# Patient Record
Sex: Female | Born: 1980 | Race: White | Hispanic: No | Marital: Single | Smoking: Heavy tobacco smoker
Health system: Southern US, Community
[De-identification: ages and names within clinical notes are randomized; demographics above are authoritative.]

## PROBLEM LIST (undated history)

## (undated) ENCOUNTER — Inpatient Hospital Stay (HOSPITAL_COMMUNITY): Payer: Self-pay

## (undated) DIAGNOSIS — C801 Malignant (primary) neoplasm, unspecified: Secondary | ICD-10-CM

## (undated) DIAGNOSIS — C569 Malignant neoplasm of unspecified ovary: Secondary | ICD-10-CM

## (undated) HISTORY — PX: OOPHORECTOMY: SHX86

---
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
## 2001-10-29 ENCOUNTER — Emergency Department (HOSPITAL_COMMUNITY): Admission: EM | Admit: 2001-10-29 | Discharge: 2001-10-30 | Payer: Self-pay | Admitting: *Deleted

## 2002-01-30 ENCOUNTER — Encounter: Payer: Self-pay | Admitting: Family Medicine

## 2002-01-30 ENCOUNTER — Encounter: Admission: RE | Admit: 2002-01-30 | Discharge: 2002-01-30 | Payer: Self-pay | Admitting: Family Medicine

## 2002-01-31 ENCOUNTER — Encounter: Payer: Self-pay | Admitting: Family Medicine

## 2002-01-31 ENCOUNTER — Encounter: Admission: RE | Admit: 2002-01-31 | Discharge: 2002-01-31 | Payer: Self-pay | Admitting: Family Medicine

## 2006-03-24 ENCOUNTER — Encounter (INDEPENDENT_AMBULATORY_CARE_PROVIDER_SITE_OTHER): Payer: Self-pay | Admitting: *Deleted

## 2007-08-12 ENCOUNTER — Inpatient Hospital Stay (HOSPITAL_COMMUNITY): Admission: AD | Admit: 2007-08-12 | Discharge: 2007-08-12 | Payer: Self-pay | Admitting: Obstetrics and Gynecology

## 2007-08-14 ENCOUNTER — Inpatient Hospital Stay (HOSPITAL_COMMUNITY): Admission: AD | Admit: 2007-08-14 | Discharge: 2007-08-14 | Payer: Self-pay | Admitting: Obstetrics and Gynecology

## 2007-08-22 ENCOUNTER — Inpatient Hospital Stay (HOSPITAL_COMMUNITY): Admission: RE | Admit: 2007-08-22 | Discharge: 2007-08-24 | Payer: Self-pay | Admitting: Obstetrics and Gynecology

## 2007-08-22 ENCOUNTER — Encounter (INDEPENDENT_AMBULATORY_CARE_PROVIDER_SITE_OTHER): Payer: Self-pay | Admitting: Obstetrics and Gynecology

## 2008-07-07 ENCOUNTER — Emergency Department (HOSPITAL_COMMUNITY): Admission: EM | Admit: 2008-07-07 | Discharge: 2008-07-07 | Payer: Self-pay | Admitting: Emergency Medicine

## 2008-07-09 ENCOUNTER — Emergency Department (HOSPITAL_COMMUNITY): Admission: EM | Admit: 2008-07-09 | Discharge: 2008-07-09 | Payer: Self-pay | Admitting: Emergency Medicine

## 2009-07-14 ENCOUNTER — Ambulatory Visit (HOSPITAL_COMMUNITY): Admission: RE | Admit: 2009-07-14 | Discharge: 2009-07-14 | Payer: Self-pay | Admitting: Obstetrics and Gynecology

## 2010-04-11 LAB — URINE MICROSCOPIC-ADD ON

## 2010-04-11 LAB — URINALYSIS, ROUTINE W REFLEX MICROSCOPIC
Bilirubin Urine: NEGATIVE
Glucose, UA: NEGATIVE mg/dL
Ketones, ur: NEGATIVE mg/dL
Protein, ur: NEGATIVE mg/dL
Urobilinogen, UA: 0.2 mg/dL (ref 0.0–1.0)

## 2010-04-11 LAB — COMPREHENSIVE METABOLIC PANEL
Albumin: 4.1 g/dL (ref 3.5–5.2)
Alkaline Phosphatase: 67 U/L (ref 39–117)
BUN: 9 mg/dL (ref 6–23)
Chloride: 107 mEq/L (ref 96–112)
Creatinine, Ser: 0.74 mg/dL (ref 0.4–1.2)
Glucose, Bld: 87 mg/dL (ref 70–99)
Potassium: 4.1 mEq/L (ref 3.5–5.1)
Total Bilirubin: 0.9 mg/dL (ref 0.3–1.2)

## 2010-04-11 LAB — CBC
HCT: 43 % (ref 36.0–46.0)
Hemoglobin: 14.2 g/dL (ref 12.0–15.0)
MCV: 87.1 fL (ref 78.0–100.0)
Platelets: 201 10*3/uL (ref 150–400)
RDW: 13.3 % (ref 11.5–15.5)

## 2010-04-11 LAB — PROTIME-INR
INR: 1.11 (ref 0.00–1.49)
Prothrombin Time: 14.2 seconds (ref 11.6–15.2)

## 2010-04-11 LAB — APTT: aPTT: 29 seconds (ref 24–37)

## 2010-06-08 NOTE — Op Note (Signed)
[name], [name]               ACCOUNT NO.:  [id number]   MEDICAL RECORD NO.:  [id number]          PATIENT TYPE:  INP   LOCATION:  9142                          FACILITY:  [facility]   PHYSICIAN:  Randye Lobo, M.D.   DATE OF BIRTH:  November 19, 1980   DATE OF PROCEDURE:  08/22/2007  DATE OF DISCHARGE:                               OPERATIVE REPORT   PREOPERATIVE DIAGNOSES:  1. Intrauterine gestation at 74 weeks' gestation.  2. Recurrent fetal variable decelerations.   POSTOPERATIVE DIAGNOSES:  1. Intrauterine gestation at 41 weeks.  2. Recurrent fetal variable decelerations.   PROCEDURE:  Vacuum-assisted vaginal delivery with midline episiotomy and  repair.   SURGEON:  Randye Lobo, MD   ASSISTANT:  Gretchen Short, PA-C   ANESTHESIA:  Epidural, local with 1% lidocaine.   ESTIMATED BLOOD LOSS:  Approximately 300 mL.   URINE OUTPUT:  Per I&O catheterization prior to the procedure.   COMPLICATIONS:  None.   INDICATIONS FOR THE PROCEDURE:  The patient is a 30 year old gravida 1,  para 76 Caucasian female at 68 weeks' gestation, [facility] August 15, 2007, by  last menstrual period and 8-week ultrasound who presented for postdate  induction on the morning of August 22, 2007.  The patient had a last  office cervical exam of 3 cm of dilation.  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  The patient's antenatal course was otherwise  unremarkable.  Upon admission, the patient's cervix was 3 cm dilated and  70% effaced with the vertex at the -2 station.  The patient underwent  artificial rupture of membranes and clear fluid was noted.  The fetal  heart rate tracing was reactive and reassuring.  The patient received  Pitocin IV as well.   The patient received an epidural for anesthesia  during her labor.  Shortly thereafter, the fetus developed a 3-minute deceleration and the  patient was noted to have hypotension at that time, which was treated  with ephedrine.  The fetal heart rate then recovered and became reactive  and reassuring.  The patient progressed through a normal active phase of  her labor.   When the patient began pushing, the fetus developed variable  decelerations.  The decelerations became more progressive and deeper  down to the 60 and 80 range with pushing along with some hyper  variability of the fetal heart rate and some decelerations with a slow  return to baseline.  A recommendation was made therefore to proceed with  a vacuum-assisted vaginal delivery with episiotomy and repair after  risks and benefits were reviewed.  The patient chose to proceed.   FINDINGS:  A viable female infant was delivered at 2056.  Apgars were 8  at 1 minute and 9 at 5 minutes.  The weight was 9 pounds and 2 ounces.  The position was occiput anterior.  The placenta had a three-vessel  cord, which had the  absence of Wharton's jelly along a large portion of  the cord.  The cord was also noted to be hyperspiraled.  The placenta  was intact.   The midline episiotomy was a normal second degree episiotomy and there  was also a left periurethral laceration, which was first degree.   PROCEDURE:  The patient's bladder was catheterized of urine.  She was  examined and the vertex was noted to be at the 2+ station with pushing.  The position was noted to be occiput anterior.  The Mityvac was applied  over the fetal vertex and over one contraction the vertex was down to  the 3+ station.  The patient was then allowed to push spontaneously.  The fetus then developed again a deceleration into the 80 and 90 range  and the Mityvac was reapplied and proper pressure was applied and the  Mityvac was used over this final contraction.  During this contraction,  there was one pop-off and the  vacuum was reapplied.  The midline  episiotomy was performed and the patient was able to deliver  successfully.  The shoulders were delivered without difficulty followed  by the remainder of the newborn.   The cord was doubly clamped and cut.  Then newborn was vigorous and  placed on the maternal chest.   A repair of the midline episiotomy and the left periurethral laceration  was performed after additional local 1% lidocaine.  The episiotomy was  repaired in standard fashion with 2-0 and 0 Vicryl.  The left  periurethral laceration was repaired with one interrupted suture of 2-0  Vicryl.   The placenta was then spontaneously delivered.  The cervix and the  vagina were examined and there were no remaining lacerations.   This concluded the patient's procedure.  There were no complications.  All needle, instrument, and sponge counts were correct.      Randye Lobo, M.D.  Electronically Signed     BES/MEDQ  D:  08/22/2007  T:  08/23/2007  Job:  [id number]

## 2010-10-22 LAB — CBC
HCT: 30.1 — ABNORMAL LOW
HCT: 35.7 — ABNORMAL LOW
Hemoglobin: 10.3 — ABNORMAL LOW
Hemoglobin: 12.3
MCHC: 34.3
MCV: 88
MCV: 88.4
Platelets: 220
RBC: 3.4 — ABNORMAL LOW
RBC: 4.06
RDW: 14.4
WBC: 12.6 — ABNORMAL HIGH

## 2011-01-03 ENCOUNTER — Emergency Department (HOSPITAL_COMMUNITY)
Admission: EM | Admit: 2011-01-03 | Discharge: 2011-01-03 | Disposition: A | Discharge status: Home / Self Care | Payer: 59 | Attending: Emergency Medicine | Admitting: Emergency Medicine

## 2011-01-03 DIAGNOSIS — Z8543 Personal history of malignant neoplasm of ovary: Secondary | ICD-10-CM | POA: Insufficient documentation

## 2011-01-03 DIAGNOSIS — A599 Trichomoniasis, unspecified: Secondary | ICD-10-CM | POA: Insufficient documentation

## 2011-01-03 DIAGNOSIS — N898 Other specified noninflammatory disorders of vagina: Secondary | ICD-10-CM | POA: Insufficient documentation

## 2011-01-03 DIAGNOSIS — N939 Abnormal uterine and vaginal bleeding, unspecified: Secondary | ICD-10-CM

## 2011-01-03 DIAGNOSIS — N949 Unspecified condition associated with female genital organs and menstrual cycle: Secondary | ICD-10-CM | POA: Insufficient documentation

## 2011-01-03 HISTORY — DX: Malignant (primary) neoplasm, unspecified: C80.1

## 2011-01-03 HISTORY — DX: Malignant neoplasm of unspecified ovary: C56.9

## 2011-01-03 LAB — URINALYSIS, ROUTINE W REFLEX MICROSCOPIC
Bilirubin Urine: NEGATIVE
Glucose, UA: NEGATIVE mg/dL
Protein, ur: NEGATIVE mg/dL
Urobilinogen, UA: 0.2 mg/dL (ref 0.0–1.0)

## 2011-01-03 LAB — POCT PREGNANCY, URINE: Preg Test, Ur: NEGATIVE

## 2011-01-03 LAB — URINE MICROSCOPIC-ADD ON

## 2011-01-03 LAB — WET PREP, GENITAL

## 2011-01-03 MED ORDER — METRONIDAZOLE 500 MG PO TABS
2000.0000 mg | ORAL_TABLET | Freq: Once | ORAL | Status: AC
  Administered 2011-01-03: 2000 mg via ORAL
  Filled 2011-01-03: qty 4

## 2011-01-03 NOTE — ED Provider Notes (Signed)
History     CSN: [id number] Arrival date & time: 01/03/2011  1:55 PM   First MD Initiated Contact with Patient 01/03/11 1755      Chief Complaint  Patient presents with  . Vaginal Bleeding    (Consider location/radiation/quality/duration/timing/severity/associated sxs/prior treatment) Patient is a 30 y.o. female presenting with vaginal bleeding. The history is provided by the patient.  Vaginal Bleeding This is a new problem. The current episode started today. The problem has been resolved. Pertinent negatives include no abdominal pain, chest pain, chills, congestion, coughing, fatigue, fever, headaches, joint swelling, nausea, neck pain, numbness, rash, sore throat, vomiting or weakness. The symptoms are aggravated by nothing. She has tried nothing for the symptoms. The treatment provided no relief.   the patient has a history of left ovarian cancer with a salpingo-oophorectomy in 2004. She is also status post an elective abortion on 10/02/2010. Yesterday evening she felt a sharp pain in the left lower quadrant that was transient and resolve spontaneously after a few moments. This afternoon, she felt a gush of blood vaginally and has had no bleeding since that time. She denies any pain associated with the vaginal bleeding. She has had no prior episodes similar to this. Her last menstrual period ended on December 3 and was normal for her. She is G3 P1021  Past Medical History  Diagnosis Date  . Cancer   . Ovarian cancer     Past Surgical History  Procedure Date  . Oophorectomy     History reviewed. No pertinent family history.  History  Substance Use Topics  . Smoking status: Current Everyday Smoker  . Smokeless tobacco: Not on file  . Alcohol Use: Yes    OB History    Grav Para Term Preterm Abortions TAB SAB Ect Mult Living   3 1 1  2     1       Review of Systems  Constitutional: Negative for fever, chills and fatigue.  HENT: Negative for congestion, sore throat, neck  pain and neck stiffness.   Eyes: Negative for pain and visual disturbance.  Respiratory: Negative for cough, chest tightness and shortness of breath.   Cardiovascular: Negative for chest pain and leg swelling.  Gastrointestinal: Negative for nausea, vomiting, abdominal pain, diarrhea and constipation.  Genitourinary: Positive for vaginal bleeding. Negative for dysuria, hematuria, flank pain, vaginal discharge, vaginal pain and pelvic pain.  Musculoskeletal: Negative for back pain, joint swelling and gait problem.  Skin: Negative for pallor and rash.  Neurological: Negative for dizziness, syncope, weakness, light-headedness, numbness and headaches.  Hematological: Does not bruise/bleed easily.  Psychiatric/Behavioral: Negative for behavioral problems and confusion.    Allergies  Review of patient's allergies indicates no known allergies.  Home Medications  No current outpatient prescriptions on file.  BP 115/74  Pulse 69  Temp(Src) 98.1 F (36.7 C) (Oral)  Resp 16  SpO2 99%  LMP 12/27/2010  Physical Exam  Nursing note and vitals reviewed. Constitutional: She is oriented to person, place, and time. She appears well-developed and well-nourished. No distress.  HENT:  Head: Normocephalic and atraumatic.  Right Ear: External ear normal.  Left Ear: External ear normal.  Mouth/Throat: Oropharynx is clear and moist.  Eyes: Conjunctivae and EOM are normal. Pupils are equal, round, and reactive to light.  Neck: Normal range of motion. Neck supple.  Cardiovascular: Normal rate, regular rhythm, normal heart sounds and intact distal pulses.   Pulmonary/Chest: Effort normal and breath sounds normal. No respiratory distress. She has no wheezes. She  exhibits no tenderness.  Abdominal: Soft. Bowel sounds are normal. She exhibits no distension and no mass. There is no tenderness.  Genitourinary: There is no rash, tenderness or lesion on the right labia. There is no rash, tenderness or lesion on  the left labia. Uterus is not tender. Cervix exhibits no motion tenderness, no discharge and no friability. Right adnexum displays no mass and no fullness. Left adnexum displays no mass and no fullness.       There is a small amount of blood in the vagina. There is mild right adnexal tenderness. There is no left adnexal tenderness. Cervical os is closed.  Musculoskeletal: Normal range of motion. She exhibits no edema and no tenderness.  Neurological: She is alert and oriented to person, place, and time. No cranial nerve deficit.  Skin: Skin is warm and dry. No rash noted. She is not diaphoretic. No pallor.  Psychiatric: She has a normal mood and affect. Her behavior is normal.    ED Course  Procedures (including critical care time)  Labs Reviewed  URINALYSIS, ROUTINE W REFLEX MICROSCOPIC - Abnormal; Notable for the following:    Hgb urine dipstick LARGE (*)    Leukocytes, UA TRACE (*)    All other components within normal limits  WET PREP, GENITAL - Abnormal; Notable for the following:    Trich, Wet Prep MANY (*)    Clue Cells, Wet Prep RARE (*)    All other components within normal limits  POCT PREGNANCY, URINE  URINE MICROSCOPIC-ADD ON  POCT PREGNANCY, URINE  GC/CHLAMYDIA PROBE AMP, GENITAL   No results found.   Diagnosis #1: Abnormal Vaginal bleeding Diagnosis #2 Trichomonas   MDM  This patient had one episode of heavy vaginal bleeding with spontaneous resolution. Her pelvic exam does not show any significant findings. I do not feel that blood work is necessary as it is unlikely that she would have anemia after one episode of vaginal bleeding. Given her history of ovarian cancer I have advised close followup with her GYN, Dr. Edward Jolly. She will call them tomorrow to arrange followup. Her urine and wet prep showed trichomonas for which she has been treated in the emergency department; there is no outpatient treatment needed. She's been advised to return to the emergency department if  she has any significant pain or increasing bleeding.        Elwyn Reach [place], [place] 01/03/11 2013

## 2011-01-03 NOTE — ED Notes (Signed)
Patient presents with large amount of vaginal bleeding with abdominal cramping.  Last menstrual period finished last Tuesday without complications.  Denies pain and urinary symptoms.

## 2011-01-03 NOTE — ED Notes (Signed)
Patient reports she had an abortion on 10-02-2010

## 2011-01-03 NOTE — ED Provider Notes (Signed)
Medical screening examination/treatment/procedure(s) were performed by non-physician practitioner and as supervising physician I was immediately available for consultation/collaboration. Devoria Albe, MD, Armando Gang   Ward Givens, MD 01/03/11 [phone]

## 2011-01-04 LAB — GC/CHLAMYDIA PROBE AMP, GENITAL: Chlamydia, DNA Probe: NEGATIVE

## 2011-01-05 ENCOUNTER — Other Ambulatory Visit: Payer: Self-pay | Admitting: Obstetrics and Gynecology

## 2013-04-08 ENCOUNTER — Other Ambulatory Visit: Payer: Self-pay | Admitting: Obstetrics & Gynecology

## 2013-04-18 ENCOUNTER — Inpatient Hospital Stay (HOSPITAL_COMMUNITY)
Admission: AD | Admit: 2013-04-18 | Discharge: 2013-04-18 | Disposition: A | Discharge status: Home / Self Care | Payer: 59 | Source: Ambulatory Visit | Attending: Obstetrics and Gynecology | Admitting: Obstetrics and Gynecology

## 2013-04-18 ENCOUNTER — Encounter (HOSPITAL_COMMUNITY): Payer: Self-pay | Admitting: *Deleted

## 2013-04-18 ENCOUNTER — Inpatient Hospital Stay (HOSPITAL_COMMUNITY): Payer: 59

## 2013-04-18 DIAGNOSIS — O99330 Smoking (tobacco) complicating pregnancy, unspecified trimester: Secondary | ICD-10-CM | POA: Insufficient documentation

## 2013-04-18 DIAGNOSIS — O9989 Other specified diseases and conditions complicating pregnancy, childbirth and the puerperium: Principal | ICD-10-CM

## 2013-04-18 DIAGNOSIS — R748 Abnormal levels of other serum enzymes: Secondary | ICD-10-CM | POA: Insufficient documentation

## 2013-04-18 DIAGNOSIS — R1011 Right upper quadrant pain: Secondary | ICD-10-CM | POA: Insufficient documentation

## 2013-04-18 DIAGNOSIS — O99891 Other specified diseases and conditions complicating pregnancy: Secondary | ICD-10-CM | POA: Insufficient documentation

## 2013-04-18 DIAGNOSIS — K824 Cholesterolosis of gallbladder: Secondary | ICD-10-CM | POA: Insufficient documentation

## 2013-04-18 LAB — LIPASE, BLOOD: LIPASE: 110 U/L — AB (ref 11–59)

## 2013-04-18 LAB — CBC
HCT: 38 % (ref 36.0–46.0)
Hemoglobin: 13.2 g/dL (ref 12.0–15.0)
MCH: 29.1 pg (ref 26.0–34.0)
MCHC: 34.7 g/dL (ref 30.0–36.0)
MCV: 83.7 fL (ref 78.0–100.0)
PLATELETS: 215 10*3/uL (ref 150–400)
RBC: 4.54 MIL/uL (ref 3.87–5.11)
RDW: 13.9 % (ref 11.5–15.5)
WBC: 9.4 10*3/uL (ref 4.0–10.5)

## 2013-04-18 LAB — COMPREHENSIVE METABOLIC PANEL
ALBUMIN: 3.8 g/dL (ref 3.5–5.2)
ALT: 13 U/L (ref 0–35)
AST: 16 U/L (ref 0–37)
Alkaline Phosphatase: 54 U/L (ref 39–117)
BUN: 10 mg/dL (ref 6–23)
CALCIUM: 9.5 mg/dL (ref 8.4–10.5)
CO2: 23 mEq/L (ref 19–32)
CREATININE: 0.68 mg/dL (ref 0.50–1.10)
Chloride: 101 mEq/L (ref 96–112)
GFR calc Af Amer: >90 mL/min (ref >90)
GFR calc non Af Amer: >90 mL/min (ref >90)
Glucose, Bld: 82 mg/dL (ref 70–99)
Potassium: 4.1 mEq/L (ref 3.7–5.3)
Sodium: 139 mEq/L (ref 137–147)
TOTAL PROTEIN: 6.9 g/dL (ref 6.0–8.3)
Total Bilirubin: 0.3 mg/dL (ref 0.3–1.2)

## 2013-04-18 LAB — URINALYSIS, ROUTINE W REFLEX MICROSCOPIC
BILIRUBIN URINE: NEGATIVE
Glucose, UA: NEGATIVE mg/dL
HGB URINE DIPSTICK: NEGATIVE
KETONES UR: NEGATIVE mg/dL
Leukocytes, UA: NEGATIVE
Nitrite: NEGATIVE
PROTEIN: NEGATIVE mg/dL
Specific Gravity, Urine: 1.02 (ref 1.005–1.030)
UROBILINOGEN UA: 0.2 mg/dL (ref 0.0–1.0)
pH: 7 (ref 5.0–8.0)

## 2013-04-18 LAB — AMYLASE: Amylase: 98 U/L (ref 0–105)

## 2013-04-18 MED ORDER — GI COCKTAIL ~~LOC~~
30.0000 mL | Freq: Once | ORAL | Status: AC
  Administered 2013-04-18: 30 mL via ORAL
  Filled 2013-04-18: qty 30

## 2013-04-18 MED ORDER — TRAMADOL HCL 50 MG PO TABS: 50.0000 mg | ORAL_TABLET | Freq: Four times a day (QID) | ORAL | Status: DC | PRN

## 2013-04-18 MED ORDER — PANTOPRAZOLE SODIUM 20 MG PO TBEC: 20.0000 mg | DELAYED_RELEASE_TABLET | Freq: Every day | ORAL | Status: DC

## 2013-04-18 NOTE — MAU Provider Note (Signed)
History     CSN: [id number]  Arrival date and time: 04/18/13 1109   First Provider Initiated Contact with Patient 04/18/13 1218      Chief Complaint  Patient presents with  . Abdominal Pain   HPI This is a 33 y.o. female at [redacted]w[redacted]d who was sent from the office with c/o RUQ pain since this morning.  Denies nausea, vomiting or fever.  Pain radiates to right back/flank.  Has been seen for New OB and pregnancy has been going well.   RN Note: Patient states she started having upper right abdominal pain that radiates around to the right upper back at about 1000 this am. Denies nausea or vomiting  OB History   Grav Para Term Preterm Abortions TAB SAB Ect Mult Living   4 1 1  2     1       Past Medical History  Diagnosis Date  . Cancer   . Ovarian cancer     Past Surgical History  Procedure Laterality Date  . Oophorectomy      No family history on file.  History  Substance Use Topics  . Smoking status: Light Tobacco Smoker  . Smokeless tobacco: Not on file  . Alcohol Use: No    Allergies: No Known Allergies  Prescriptions prior to admission  Medication Sig Dispense Refill  . Prenatal Vit-Fe Fumarate-FA (PRENATAL MULTIVITAMIN) TABS tablet Take 1 tablet by mouth daily at 12 noon.        Review of Systems  Constitutional: Negative for fever, chills and malaise/fatigue.  Gastrointestinal: Positive for abdominal pain. Negative for nausea, vomiting, diarrhea and constipation.  Genitourinary: Negative for dysuria.  Musculoskeletal: Negative for myalgias.  Neurological: Negative for dizziness and headaches.   Physical Exam   Blood pressure 125/72, pulse 71, temperature 98.4 F (36.9 C), temperature source Oral, resp. rate 18, height 5\' 9"  (1.753 m), weight 97.614 kg (215 lb 3.2 oz), last menstrual period 02/11/2013, SpO2 99.00%.  Physical Exam  Constitutional: She is oriented to person, place, and time. She appears well-developed and well-nourished. No distress.  HENT:   Head: Normocephalic.  Cardiovascular: Normal rate.   Respiratory: Effort normal.  GI: Soft. She exhibits no distension and no mass. There is tenderness (tender over RUQ but no Murphy's sign.  No CVA tenderness). There is no rebound and no guarding.  Musculoskeletal: Normal range of motion.  Neurological: She is alert and oriented to person, place, and time.  Skin: Skin is warm and dry.  Psychiatric: She has a normal mood and affect.    MAU Course  Procedures  MDM Discussed with Dr Radene Knee Will get labs and RUQ [name]. Results for orders placed during the hospital encounter of 04/18/13 (from the past 24 hour(s))  URINALYSIS, ROUTINE W REFLEX MICROSCOPIC     Status: None   Collection Time    04/18/13 11:27 AM      Result Value Ref Range   Color, Urine YELLOW  YELLOW   APPearance CLEAR  CLEAR   Specific Gravity, Urine 1.020  1.005 - 1.030   pH 7.0  5.0 - 8.0   Glucose, UA NEGATIVE  NEGATIVE mg/dL   Hgb urine dipstick NEGATIVE  NEGATIVE   Bilirubin Urine NEGATIVE  NEGATIVE   Ketones, ur NEGATIVE  NEGATIVE mg/dL   Protein, ur NEGATIVE  NEGATIVE mg/dL   Urobilinogen, UA 0.2  0.0 - 1.0 mg/dL   Nitrite NEGATIVE  NEGATIVE   Leukocytes, UA NEGATIVE  NEGATIVE  CBC  Status: None   Collection Time    04/18/13 12:30 PM      Result Value Ref Range   WBC 9.4  4.0 - 10.5 K/uL   RBC 4.54  3.87 - 5.11 MIL/uL   Hemoglobin 13.2  12.0 - 15.0 g/dL   HCT 38.0  36.0 - 46.0 %   MCV 83.7  78.0 - 100.0 fL   MCH 29.1  26.0 - 34.0 pg   MCHC 34.7  30.0 - 36.0 g/dL   RDW 13.9  11.5 - 15.5 %   Platelets 215  150 - 400 K/uL  COMPREHENSIVE METABOLIC PANEL     Status: None   Collection Time    04/18/13 12:30 PM      Result Value Ref Range   Sodium 139  137 - 147 mEq/L   Potassium 4.1  3.7 - 5.3 mEq/L   Chloride 101  96 - 112 mEq/L   CO2 23  19 - 32 mEq/L   Glucose, Bld 82  70 - 99 mg/dL   BUN 10  6 - 23 mg/dL   Creatinine, Ser 0.68  0.50 - 1.10 mg/dL   Calcium 9.5  8.4 - 10.5 mg/dL   Total  Protein 6.9  6.0 - 8.3 g/dL   Albumin 3.8  3.5 - 5.2 g/dL   AST 16  0 - 37 U/L   ALT 13  0 - 35 U/L   Alkaline Phosphatase 54  39 - 117 U/L   Total Bilirubin 0.3  0.3 - 1.2 mg/dL   GFR calc non Af Amer >90  >90 mL/min   GFR calc Af Amer >90  >90 mL/min  AMYLASE     Status: None   Collection Time    04/18/13 12:30 PM      Result Value Ref Range   Amylase 98  0 - 105 U/L  LIPASE, BLOOD     Status: Abnormal   Collection Time    04/18/13 12:30 PM      Result Value Ref Range   Lipase 110 (*) 11 - 59 U/L   US Abdomen Limited Ruq  04/18/2013   CLINICAL DATA:  Right upper quadrant pain  EXAM: US ABDOMEN LIMITED - RIGHT UPPER QUADRANT  COMPARISON:  None.  FINDINGS: Gallbladder:  No gallstones or wall thickening visualized. No sonographic Murphy sign noted. There is a 4 mm echogenic non mobile, non shadowing focus along the gallbladder wall likely representing a small polyp.  Common bile duct:  Diameter:  4 mm  Liver:  No focal lesion identified. Within normal limits in parenchymal echogenicity.    IMPRESSION: 1. No cholelithiasis or sonographic evidence of acute cholecystitis. 2. Small gallbladder polyp.    Electronically Signed   By: Kathreen Devoid   On: 04/18/2013 13:29   Assessment and Plan  A:  SIUP at [redacted]w[redacted]d        Abdominal pain, right upper quadrant       Elevated Lipase       Gallbladder Polyp  P:  Discussed with Dr Philis Pique       Discharge home       Instructed to come back or call if develops N/V       Followup labs in one week in office       Will also add Rx for Tramadol and Protonix  [facility] 04/18/2013, 12:19 PM

## 2013-04-18 NOTE — MAU Note (Signed)
Patient states she started having upper right abdominal pain that radiates around to the right upper back at about 1000 this am. Denies nausea or vomiting.

## 2013-04-18 NOTE — Discharge Instructions (Signed)
Abdominal Pain During Pregnancy °Abdominal pain is common in pregnancy. Most of the time, it does not cause harm. There are many causes of abdominal pain. Some causes are more serious than others. Some of the causes of abdominal pain in pregnancy are easily diagnosed. Occasionally, the diagnosis takes time to understand. Other times, the cause is not determined. Abdominal pain can be a sign that something is very wrong with the pregnancy, or the pain may have nothing to do with the pregnancy at all. For this reason, always tell your health care provider if you have any abdominal discomfort. °HOME CARE INSTRUCTIONS  °Monitor your abdominal pain for any changes. The following actions may help to alleviate any discomfort you are experiencing: °· Do not have sexual intercourse or put anything in your vagina until your symptoms go away completely. °· Get plenty of rest until your pain improves. °· Drink clear fluids if you feel nauseous. Avoid solid food as long as you are uncomfortable or nauseous. °· Only take over-the-counter or prescription medicine as directed by your health care provider. °· Keep all follow-up appointments with your health care provider. °SEEK IMMEDIATE MEDICAL CARE IF: °· You are bleeding, leaking fluid, or passing tissue from the vagina. °· You have increasing pain or cramping. °· You have persistent vomiting. °· You have painful or bloody urination. °· You have a fever. °· You notice a decrease in your baby's movements. °· You have extreme weakness or feel faint. °· You have shortness of breath, with or without abdominal pain. °· You develop a severe headache with abdominal pain. °· You have abnormal vaginal discharge with abdominal pain. °· You have persistent diarrhea. °· You have abdominal pain that continues even after rest, or gets worse. °MAKE SURE YOU:  °· Understand these instructions. °· Will watch your condition. °· Will get help right away if you are not doing well or get  worse. °Document Released: 01/10/2005 Document Revised: 10/31/2012 Document Reviewed: 08/09/2012 °[employer]® Patient Information ©2014 [employer]. ° °

## 2013-04-24 NOTE — Progress Notes (Signed)
Prior authorization for Pantoprazole approved today 04/24/13 at 0829. Pt. Can now fill prescription.

## 2013-05-16 ENCOUNTER — Encounter (HOSPITAL_COMMUNITY): Payer: 59 | Admitting: Anesthesiology

## 2013-05-16 ENCOUNTER — Encounter (HOSPITAL_COMMUNITY): Payer: Self-pay

## 2013-05-16 ENCOUNTER — Ambulatory Visit (HOSPITAL_COMMUNITY)
Admission: RE | Admit: 2013-05-16 | Discharge: 2013-05-16 | Disposition: A | Discharge status: Home / Self Care | Payer: 59 | Source: Ambulatory Visit | Attending: Obstetrics & Gynecology | Admitting: Obstetrics & Gynecology

## 2013-05-16 ENCOUNTER — Encounter (HOSPITAL_COMMUNITY)
Admission: RE | Disposition: A | Discharge status: Home / Self Care | Payer: Self-pay | Source: Ambulatory Visit | Attending: Obstetrics & Gynecology

## 2013-05-16 ENCOUNTER — Ambulatory Visit (HOSPITAL_COMMUNITY): Payer: 59 | Admitting: Anesthesiology

## 2013-05-16 DIAGNOSIS — O021 Missed abortion: Secondary | ICD-10-CM | POA: Insufficient documentation

## 2013-05-16 DIAGNOSIS — F17200 Nicotine dependence, unspecified, uncomplicated: Secondary | ICD-10-CM | POA: Insufficient documentation

## 2013-05-16 DIAGNOSIS — O034 Incomplete spontaneous abortion without complication: Secondary | ICD-10-CM

## 2013-05-16 HISTORY — PX: DILATION AND EVACUATION: SHX1459

## 2013-05-16 LAB — CBC
HCT: 39.5 % (ref 36.0–46.0)
Hemoglobin: 13.9 g/dL (ref 12.0–15.0)
MCH: 29.6 pg (ref 26.0–34.0)
MCHC: 35.2 g/dL (ref 30.0–36.0)
MCV: 84.2 fL (ref 78.0–100.0)
PLATELETS: 219 10*3/uL (ref 150–400)
RBC: 4.69 MIL/uL (ref 3.87–5.11)
RDW: 13.7 % (ref 11.5–15.5)
WBC: 6.7 10*3/uL (ref 4.0–10.5)

## 2013-05-16 SURGERY — DILATION AND EVACUATION, UTERUS
Anesthesia: Monitor Anesthesia Care | Site: Vagina

## 2013-05-16 MED ORDER — LACTATED RINGERS IV SOLN
Freq: Once | INTRAVENOUS | Status: AC
  Administered 2013-05-16: 09:00:00 via INTRAVENOUS

## 2013-05-16 MED ORDER — PROPOFOL 10 MG/ML IV EMUL
INTRAVENOUS | Status: AC
  Filled 2013-05-16: qty 20

## 2013-05-16 MED ORDER — PROPOFOL 10 MG/ML IV EMUL
INTRAVENOUS | Status: DC | PRN
  Administered 2013-05-16: 30 mg via INTRAVENOUS
  Administered 2013-05-16: 50 mg via INTRAVENOUS
  Administered 2013-05-16 (×4): 30 mg via INTRAVENOUS

## 2013-05-16 MED ORDER — KETOROLAC TROMETHAMINE 30 MG/ML IJ SOLN: 15.0000 mg | Freq: Once | INTRAMUSCULAR | Status: DC | PRN

## 2013-05-16 MED ORDER — MIDAZOLAM HCL 2 MG/2ML IJ SOLN
INTRAMUSCULAR | Status: AC
  Filled 2013-05-16: qty 2

## 2013-05-16 MED ORDER — FENTANYL CITRATE 0.05 MG/ML IJ SOLN
INTRAMUSCULAR | Status: DC | PRN
  Administered 2013-05-16 (×2): 50 ug via INTRAVENOUS

## 2013-05-16 MED ORDER — SILVER NITRATE-POT NITRATE 75-25 % EX MISC
CUTANEOUS | Status: DC | PRN
  Administered 2013-05-16: 2

## 2013-05-16 MED ORDER — MEPERIDINE HCL 25 MG/ML IJ SOLN
6.2500 mg | INTRAMUSCULAR | Status: DC | PRN
Start: 2013-05-16 — End: 2013-05-16

## 2013-05-16 MED ORDER — DEXAMETHASONE SODIUM PHOSPHATE 10 MG/ML IJ SOLN
INTRAMUSCULAR | Status: DC | PRN
  Administered 2013-05-16: 10 mg via INTRAVENOUS

## 2013-05-16 MED ORDER — PROMETHAZINE HCL 25 MG/ML IJ SOLN: 6.2500 mg | INTRAMUSCULAR | Status: DC | PRN

## 2013-05-16 MED ORDER — FENTANYL CITRATE 0.05 MG/ML IJ SOLN
INTRAMUSCULAR | Status: AC
  Filled 2013-05-16: qty 2

## 2013-05-16 MED ORDER — MIDAZOLAM HCL 2 MG/2ML IJ SOLN
INTRAMUSCULAR | Status: DC | PRN
  Administered 2013-05-16: 2 mg via INTRAVENOUS

## 2013-05-16 MED ORDER — LIDOCAINE HCL (CARDIAC) 20 MG/ML IV SOLN
INTRAVENOUS | Status: AC
  Filled 2013-05-16: qty 5

## 2013-05-16 MED ORDER — KETOROLAC TROMETHAMINE 30 MG/ML IJ SOLN
INTRAMUSCULAR | Status: AC
  Filled 2013-05-16: qty 1

## 2013-05-16 MED ORDER — FENTANYL CITRATE 0.05 MG/ML IJ SOLN
25.0000 ug | INTRAMUSCULAR | Status: DC | PRN
  Administered 2013-05-16: 25 ug via INTRAVENOUS

## 2013-05-16 MED ORDER — LIDOCAINE HCL 1 % IJ SOLN
INTRAMUSCULAR | Status: DC | PRN
  Administered 2013-05-16: 10 mL

## 2013-05-16 MED ORDER — DEXAMETHASONE SODIUM PHOSPHATE 10 MG/ML IJ SOLN
INTRAMUSCULAR | Status: AC
  Filled 2013-05-16: qty 1

## 2013-05-16 MED ORDER — LIDOCAINE HCL (CARDIAC) 20 MG/ML IV SOLN
INTRAVENOUS | Status: DC | PRN
  Administered 2013-05-16: 80 mg via INTRAVENOUS

## 2013-05-16 MED ORDER — DOXYCYCLINE HYCLATE 100 MG IV SOLR
100.0000 mg | Freq: Once | INTRAVENOUS | Status: AC
  Administered 2013-05-16: 100 mg via INTRAVENOUS
  Filled 2013-05-16: qty 100

## 2013-05-16 MED ORDER — ONDANSETRON HCL 4 MG/2ML IJ SOLN
INTRAMUSCULAR | Status: AC
  Filled 2013-05-16: qty 2

## 2013-05-16 MED ORDER — LACTATED RINGERS IV SOLN
INTRAVENOUS | Status: DC | PRN
  Administered 2013-05-16 (×2): via INTRAVENOUS

## 2013-05-16 MED ORDER — LIDOCAINE HCL 1 % IJ SOLN
INTRAMUSCULAR | Status: AC
  Filled 2013-05-16: qty 20

## 2013-05-16 MED ORDER — MIDAZOLAM HCL 2 MG/2ML IJ SOLN: 0.5000 mg | Freq: Once | INTRAMUSCULAR | Status: DC | PRN

## 2013-05-16 SURGICAL SUPPLY — 21 items
CATH ROBINSON RED A/P 16FR (CATHETERS) ×2 IMPLANT
CLOTH BEACON ORANGE TIMEOUT ST (SAFETY) ×2 IMPLANT
DECANTER SPIKE VIAL GLASS SM (MISCELLANEOUS) ×2 IMPLANT
DILATOR CANAL MILEX (MISCELLANEOUS) IMPLANT
GLOVE BIO SURGEON STRL SZ 6.5 (GLOVE) ×2 IMPLANT
GLOVE BIOGEL PI IND STRL 7.0 (GLOVE) ×2 IMPLANT
GLOVE BIOGEL PI INDICATOR 7.0 (GLOVE) ×2
GOWN STRL REUS W/TWL LRG LVL3 (GOWN DISPOSABLE) ×4 IMPLANT
KIT BERKELEY 1ST TRIMESTER 3/8 (MISCELLANEOUS) ×2 IMPLANT
NEEDLE SPNL 22GX3.5 QUINCKE BK (NEEDLE) ×2 IMPLANT
NS IRRIG 1000ML POUR BTL (IV SOLUTION) ×2 IMPLANT
PACK VAGINAL MINOR WOMEN LF (CUSTOM PROCEDURE TRAY) ×2 IMPLANT
PAD OB MATERNITY 4.3X12.25 (PERSONAL CARE ITEMS) ×2 IMPLANT
PAD PREP 24X48 CUFFED NSTRL (MISCELLANEOUS) ×2 IMPLANT
SET BERKELEY SUCTION TUBING (SUCTIONS) ×2 IMPLANT
SYR CONTROL 10ML LL (SYRINGE) ×2 IMPLANT
TOWEL OR 17X24 6PK STRL BLUE (TOWEL DISPOSABLE) ×4 IMPLANT
VACURETTE 10 RIGID CVD (CANNULA) IMPLANT
VACURETTE 7MM CVD STRL WRAP (CANNULA) IMPLANT
VACURETTE 8 RIGID CVD (CANNULA) ×2 IMPLANT
VACURETTE 9 RIGID CVD (CANNULA) IMPLANT

## 2013-05-16 NOTE — Transfer of Care (Signed)
Immediate Anesthesia Transfer of Care Note  Patient: [name]  Procedure(s) Performed: Procedure(s): DILATATION AND EVACUATION (N/A)  Patient Location: PACU  Anesthesia Type:MAC  Level of Consciousness: awake, alert  and oriented  Airway & Oxygen Therapy: Patient Spontanous Breathing  Post-op Assessment: Report given to PACU RN, Post -op Vital signs reviewed and stable and Patient moving all extremities  Post vital signs: Reviewed and stable  Complications: No apparent anesthesia complications

## 2013-05-16 NOTE — Op Note (Signed)
Procedure(s): DILATATION AND EVACUATION Procedure Note  [name] female 33 y.o. 05/16/2013  Procedure(s) and Anesthesia Type:    * DILATATION AND EVACUATION - Choice  Surgeon(s) and Role:    * Sanjuana Kava, MD - Primary   Indications: The patient with retained products of conception after failed medical management     Surgeon: Sanjuana Kava   Assistants: none  Anesthesia: General mask inhalational anesthesia and Local anesthesia 1% buffered lidocaine  ASA Class: 2    Procedure Detail  DILATATION AND EVACUATION  Findings:  8 week size uterus to 6 size post procedure. Moderate amount of retained POC.  Good crie was achieved.  Estimated Blood Loss:  less than 50 mL         Drains: straight cath prior to procedure         Total IV Fluids: 1000 ml  Blood Given: none          Specimens: POC         Implants: none        Complications:  * No complications entered in OR log *         Technique:   After adequate anesthesia was achieved, the patient was prepped and draped in the usual sterile fashion.  The speculum was placed in the vagina and the cervix stabilized with a single-tooth tenaculum.  The cervix was dilated with Kennon Rounds dilators and the 8 mm curette was used to remove contents of the uterus.  Alternating sharp curettage with a curette and suction curettage was performed until all contents were removed and good crie was achieved.  All instruments were removed from the vagina.  The patient tolerated the procedure well.    Rogelio Seen Dynver [place]mson  Disposition: PACU - hemodynamically stable.         Condition: stable

## 2013-05-16 NOTE — Anesthesia Postprocedure Evaluation (Signed)
Anesthesia Post Note  Patient: [name]  Procedure(s) Performed: Procedure(s) (LRB): DILATATION AND EVACUATION (N/A)  Anesthesia type: MAC  Patient location: PACU  Post pain: Pain level controlled  Post assessment: Post-op Vital signs reviewed  Last Vitals:  Filed Vitals:   05/16/13 1030  BP: 101/64  Pulse: 59  Temp:   Resp: 16    Post vital signs: Reviewed  Level of consciousness: sedated  Complications: No apparent anesthesia complications

## 2013-05-16 NOTE — Discharge Instructions (Signed)
DISCHARGE INSTRUCTIONS: D&C / D&E The following instructions have been prepared to help you care for yourself upon your return home.  MAY TAKE IBUPROFEN AFTER 4:15 PM AS NEEDED FOR CRAMPS   Personal hygiene:  Use sanitary pads for vaginal drainage, not tampons.  Shower the day after your procedure.  NO tub baths, pools or Jacuzzis for 2-3 weeks.  Wipe front to back after using the bathroom.  Activity and limitations:  Do NOT drive or operate any equipment for 24 hours. The effects of anesthesia are still present and drowsiness may result.  Do NOT rest in bed all day.  Walking is encouraged.  Walk up and down stairs slowly.  You may resume your normal activity in one to two days or as indicated by your physician.  Sexual activity: NO intercourse for at least 2 weeks after the procedure, or as indicated by your physician.  Diet: Eat a light meal as desired this evening. You may resume your usual diet tomorrow.  Return to work: You may resume your work activities in one to two days or as indicated by your doctor.  What to expect after your surgery: Expect to have vaginal bleeding/discharge for 2-3 days and spotting for up to 10 days. It is not unusual to have soreness for up to 1-2 weeks. You may have a slight burning sensation when you urinate for the first day. Mild cramps may continue for a couple of days. You may have a regular period in 2-6 weeks.  Call your doctor for any of the following:  Excessive vaginal bleeding, saturating and changing one pad every hour.  Inability to urinate 6 hours after discharge from hospital.  Pain not relieved by pain medication.  Fever of 100.4 F or greater.  Unusual vaginal discharge or odor.   Call for an appointment:    Patients signature: ______________________  Nurses signature ________________________  Support person's signature_______________________

## 2013-05-16 NOTE — Discharge Summary (Signed)
Physician Discharge Summary  Patient ID: MADASYN HEATH MRN: [id number] DOB/AGE: 1980-09-10 33 y.o.  Admit date: 05/16/2013 Discharge date: 05/16/2013  Admission Diagnoses:  Discharge Diagnoses:  Active Problems:   * No active hospital problems. *   Discharged Condition: good  Hospital Course: Patient taken to OR for D&E after failed medical management.  Operative course uncomplicated.   Consults: None  Significant Diagnostic Studies: none  Treatments: IV hydration, antibiotics: Doxycycline, analgesia: acetaminophen w/ codeine and surgery: D&E  Discharge Exam: Blood pressure 115/66, pulse 94, temperature 97.9 F (36.6 C), temperature source Oral, resp. rate 18, height 5\' 9"  (1.753 m), weight 97.523 kg (215 lb), last menstrual period 02/11/2013, SpO2 98.00%. General appearance: alert, cooperative and no distress Pelvic: cervix normal in appearance, external genitalia normal, no adnexal masses or tenderness, no cervical motion tenderness, rectovaginal septum normal, uterus normal size, shape, and consistency and Minimal bleeding post op  Disposition: 01-Home or Self Care     Medication List         amphetamine-dextroamphetamine 20 MG tablet  Commonly known as:  ADDERALL  Take 20 mg by mouth daily.     doxycycline 100 MG tablet  Commonly known as:  VIBRA-TABS  Take 100 mg by mouth 2 (two) times daily.     OVER THE COUNTER MEDICATION  Take 1 tablet by mouth as needed. Patient takes over the counter medication for allergies           Follow-up Information   Follow up with [facility], Andrez Grime, MD. Call in 4 weeks.   Specialty:  Obstetrics and Gynecology   Contact information:   [address] [place] [address] [phone]       Signed: Sanjuana Kava 05/16/2013, 10:27 AM

## 2013-05-16 NOTE — H&P (Signed)
[name] is an 33 y.o. female presents for D&E for retained products of conception After failed medical management.   Pertinent Gynecological History: Menses: normal Bleeding: normal Contraception: none DES exposure: denies Blood transfusions: none Sexually transmitted diseases: no past history Previous GYN Procedures: LSO for endodermal sinus tumor  Last mammogram: N/A Last pap: normal Date: 2015 OB History: G4, P1   Menstrual History: Menarche age: 45  Patient's last menstrual period was 02/11/2013.    Past Medical History  Diagnosis Date  . Cancer   . Ovarian cancer     Past Surgical History  Procedure Laterality Date  . Oophorectomy      History reviewed. No pertinent family history.  Social History:  reports that she has been smoking Cigarettes.  She has been smoking about 1.00 pack per day. She does not have any smokeless tobacco history on file. She reports that she does not drink alcohol or use illicit drugs.  Allergies: No Known Allergies  Prescriptions prior to admission  Medication Sig Dispense Refill  . amphetamine-dextroamphetamine (ADDERALL) 20 MG tablet Take 20 mg by mouth daily.      Marland Kitchen doxycycline (VIBRA-TABS) 100 MG tablet Take 100 mg by mouth 2 (two) times daily.      Marland Kitchen OVER THE COUNTER MEDICATION Take 1 tablet by mouth as needed. Patient takes over the counter medication for allergies        Review of Systems  Constitutional: Negative.  Negative for fever and chills.  Eyes: Positive for double vision. Negative for blurred vision.  Cardiovascular: Negative for chest pain and palpitations.  Gastrointestinal: Negative for heartburn.  Genitourinary: Negative for dysuria.  Neurological: Negative for dizziness.  Endo/Heme/Allergies: Does not bruise/bleed easily.  Psychiatric/Behavioral: Negative for depression.    Blood pressure 115/66, pulse 94, temperature 97.9 F (36.6 C), temperature source Oral, resp. rate 18, height 5\' 9"  (1.753 m),  weight 97.523 kg (215 lb), last menstrual period 02/11/2013, SpO2 98.00%. Physical Exam  Nursing note and vitals reviewed. Constitutional: She appears well-developed and well-nourished.  HENT:  Head: Normocephalic.  Cardiovascular: Normal rate and regular rhythm.   Respiratory: Effort normal.  Genitourinary: Vagina normal and uterus normal.  Cervical exam yesterday in office: abnormal yellow discharge, no blood from os. No CMT, Uterus non tender Bedside TV sono: Heterogeneous material in endometrium, stripe measures 1.6cm  Neurological: She is alert. She has normal reflexes.    No results found for this or any previous visit (from the past 24 hour(s)).  No results found.  Assessment/Plan: 33 yo with retained POC after failed medical management To OR for D&E IV doxycycline for prophylaxis  [name] 05/16/2013, 9:13 AM

## 2013-05-16 NOTE — Brief Op Note (Signed)
05/16/2013  10:14 AM  PATIENT:  [name]  33 y.o. female  PRE-OPERATIVE DIAGNOSIS:  MAB  POST-OPERATIVE DIAGNOSIS:  * No post-op diagnosis entered *  PROCEDURE:  Procedure(s): DILATATION AND EVACUATION (N/A)  SURGEON:  Surgeon(s) and Role:    * Sanjuana Kava, MD - Primary  PHYSICIAN ASSISTANT:   ASSISTANTS: none   ANESTHESIA:   local and IV sedation PROCEDURE: D&E  EBL:  Total I/O In: 1000 [I.V.:1000] Out: 35 [Urine:10; Blood:25]  BLOOD ADMINISTERED:none  DRAINS: none   LOCAL MEDICATIONS USED:  LIDOCAINE   SPECIMEN:  Source of Specimen:  products of conception  DISPOSITION OF SPECIMEN:  PATHOLOGY  COUNTS:  YES  TOURNIQUET:  * No tourniquets in log *  DICTATION: .Note written in EPIC  PLAN OF CARE: Discharge to home after PACU  PATIENT DISPOSITION:  PACU - hemodynamically stable.   Delay start of Pharmacological VTE agent (>24hrs) due to surgical blood loss or risk of bleeding: no  [employer]

## 2013-05-16 NOTE — Anesthesia Preprocedure Evaluation (Signed)
Anesthesia Evaluation  Patient identified by MRN, date of birth, ID band Patient awake    Reviewed: Allergy & Precautions, H&P , Patient's Chart, lab work & pertinent test results, reviewed documented beta blocker date and time   History of Anesthesia Complications Negative for: history of anesthetic complications  Airway Mallampati: II TM Distance: >3 FB Neck ROM: full    Dental   Pulmonary Current Smoker,  breath sounds clear to auscultation        Cardiovascular Exercise Tolerance: Good Rhythm:regular Rate:Normal     Neuro/Psych negative psych ROS   GI/Hepatic   Endo/Other    Renal/GU      Musculoskeletal   Abdominal   Peds  Hematology   Anesthesia Other Findings   Reproductive/Obstetrics                           Anesthesia Physical Anesthesia Plan  ASA: II  Anesthesia Plan: MAC   Post-op Pain Management:    Induction:   Airway Management Planned:   Additional Equipment:   Intra-op Plan:   Post-operative Plan:   Informed Consent: I have reviewed the patients History and Physical, chart, labs and discussed the procedure including the risks, benefits and alternatives for the proposed anesthesia with the patient or authorized representative who has indicated his/her understanding and acceptance.   Dental Advisory Given  Plan Discussed with: CRNA, Surgeon and Anesthesiologist  Anesthesia Plan Comments:         Anesthesia Quick Evaluation  

## 2013-05-17 ENCOUNTER — Encounter (HOSPITAL_COMMUNITY): Payer: Self-pay | Admitting: Obstetrics & Gynecology

## 2013-09-19 ENCOUNTER — Encounter (HOSPITAL_COMMUNITY): Payer: Self-pay | Admitting: *Deleted

## 2013-09-20 ENCOUNTER — Encounter (HOSPITAL_COMMUNITY): Payer: Self-pay | Admitting: Anesthesiology

## 2013-09-20 ENCOUNTER — Encounter (HOSPITAL_COMMUNITY)
Admission: RE | Disposition: A | Discharge status: Home / Self Care | Payer: Self-pay | Source: Ambulatory Visit | Attending: Obstetrics & Gynecology

## 2013-09-20 ENCOUNTER — Encounter (HOSPITAL_COMMUNITY): Payer: 59 | Admitting: Anesthesiology

## 2013-09-20 ENCOUNTER — Ambulatory Visit (HOSPITAL_COMMUNITY): Payer: 59 | Admitting: Anesthesiology

## 2013-09-20 ENCOUNTER — Ambulatory Visit (HOSPITAL_COMMUNITY)
Admission: RE | Admit: 2013-09-20 | Discharge: 2013-09-20 | Disposition: A | Discharge status: Home / Self Care | Payer: 59 | Source: Ambulatory Visit | Attending: Obstetrics & Gynecology | Admitting: Obstetrics & Gynecology

## 2013-09-20 DIAGNOSIS — F17200 Nicotine dependence, unspecified, uncomplicated: Secondary | ICD-10-CM | POA: Insufficient documentation

## 2013-09-20 DIAGNOSIS — O034 Incomplete spontaneous abortion without complication: Secondary | ICD-10-CM | POA: Diagnosis present

## 2013-09-20 HISTORY — PX: DILATION AND EVACUATION: SHX1459

## 2013-09-20 LAB — CBC
HCT: 37.3 % (ref 36.0–46.0)
Hemoglobin: 13.3 g/dL (ref 12.0–15.0)
MCH: 30.5 pg (ref 26.0–34.0)
MCHC: 35.7 g/dL (ref 30.0–36.0)
MCV: 85.6 fL (ref 78.0–100.0)
Platelets: 206 K/uL (ref 150–400)
RBC: 4.36 MIL/uL (ref 3.87–5.11)
RDW: 13.2 % (ref 11.5–15.5)
WBC: 8.4 K/uL (ref 4.0–10.5)

## 2013-09-20 SURGERY — DILATION AND EVACUATION, UTERUS
Anesthesia: Monitor Anesthesia Care | Site: Vagina

## 2013-09-20 MED ORDER — KETOROLAC TROMETHAMINE 30 MG/ML IJ SOLN: 15.0000 mg | Freq: Once | INTRAMUSCULAR | Status: DC | PRN

## 2013-09-20 MED ORDER — LIDOCAINE HCL (CARDIAC) 20 MG/ML IV SOLN
INTRAVENOUS | Status: DC | PRN
  Administered 2013-09-20 (×2): 50 mg via INTRAVENOUS

## 2013-09-20 MED ORDER — MIDAZOLAM HCL 2 MG/2ML IJ SOLN
INTRAMUSCULAR | Status: AC
  Filled 2013-09-20: qty 2

## 2013-09-20 MED ORDER — DEXAMETHASONE SODIUM PHOSPHATE 10 MG/ML IJ SOLN
INTRAMUSCULAR | Status: AC
  Filled 2013-09-20: qty 1

## 2013-09-20 MED ORDER — LIDOCAINE-EPINEPHRINE 1 %-1:100000 IJ SOLN
INTRAMUSCULAR | Status: AC
Start: 2013-09-20 — End: 2013-09-20
  Filled 2013-09-20: qty 1

## 2013-09-20 MED ORDER — DEXAMETHASONE SODIUM PHOSPHATE 4 MG/ML IJ SOLN
INTRAMUSCULAR | Status: DC | PRN
  Administered 2013-09-20: 10 mg via INTRAVENOUS

## 2013-09-20 MED ORDER — PROPOFOL 10 MG/ML IV BOLUS
INTRAVENOUS | Status: DC | PRN
  Administered 2013-09-20: 40 mg via INTRAVENOUS
  Administered 2013-09-20: 20 mg via INTRAVENOUS
  Administered 2013-09-20: 40 mg via INTRAVENOUS
  Administered 2013-09-20: 30 mg via INTRAVENOUS
  Administered 2013-09-20: 40 mg via INTRAVENOUS

## 2013-09-20 MED ORDER — LIDOCAINE HCL (CARDIAC) 20 MG/ML IV SOLN
INTRAVENOUS | Status: AC
  Filled 2013-09-20: qty 5

## 2013-09-20 MED ORDER — OXYCODONE-ACETAMINOPHEN 7.5-325 MG PO TABS: 1.0000 | ORAL_TABLET | ORAL | Status: DC | PRN

## 2013-09-20 MED ORDER — CEFAZOLIN SODIUM-DEXTROSE 2-3 GM-% IV SOLR
INTRAVENOUS | Status: AC
  Administered 2013-09-20: 2 g via INTRAVENOUS
  Filled 2013-09-20: qty 50

## 2013-09-20 MED ORDER — MIDAZOLAM HCL 5 MG/5ML IJ SOLN
INTRAMUSCULAR | Status: DC | PRN
  Administered 2013-09-20: 2 mg via INTRAVENOUS

## 2013-09-20 MED ORDER — SCOPOLAMINE 1 MG/3DAYS TD PT72
MEDICATED_PATCH | TRANSDERMAL | Status: AC
  Filled 2013-09-20: qty 1

## 2013-09-20 MED ORDER — ONDANSETRON HCL 4 MG/2ML IJ SOLN
INTRAMUSCULAR | Status: AC
  Filled 2013-09-20: qty 2

## 2013-09-20 MED ORDER — MEPERIDINE HCL 25 MG/ML IJ SOLN: 6.2500 mg | INTRAMUSCULAR | Status: DC | PRN

## 2013-09-20 MED ORDER — FENTANYL CITRATE 0.05 MG/ML IJ SOLN
INTRAMUSCULAR | Status: DC | PRN
  Administered 2013-09-20 (×2): 50 ug via INTRAVENOUS

## 2013-09-20 MED ORDER — LACTATED RINGERS IV SOLN
INTRAVENOUS | Status: DC
  Administered 2013-09-20 (×2): via INTRAVENOUS

## 2013-09-20 MED ORDER — FENTANYL CITRATE 0.05 MG/ML IJ SOLN
25.0000 ug | INTRAMUSCULAR | Status: DC | PRN
  Administered 2013-09-20: 50 ug via INTRAVENOUS

## 2013-09-20 MED ORDER — PROPOFOL 10 MG/ML IV EMUL
INTRAVENOUS | Status: AC
  Filled 2013-09-20: qty 20

## 2013-09-20 MED ORDER — FENTANYL CITRATE 0.05 MG/ML IJ SOLN
INTRAMUSCULAR | Status: AC
  Filled 2013-09-20: qty 2

## 2013-09-20 MED ORDER — ACETAMINOPHEN 160 MG/5ML PO SOLN: 975.0000 mg | Freq: Once | ORAL | Status: DC

## 2013-09-20 MED ORDER — SCOPOLAMINE 1 MG/3DAYS TD PT72
1.0000 | MEDICATED_PATCH | Freq: Once | TRANSDERMAL | Status: DC
  Administered 2013-09-20: 1.5 mg via TRANSDERMAL

## 2013-09-20 MED ORDER — LIDOCAINE HCL 1 % IJ SOLN
INTRAMUSCULAR | Status: AC
  Filled 2013-09-20: qty 20

## 2013-09-20 MED ORDER — LIDOCAINE HCL 1 % IJ SOLN
INTRAMUSCULAR | Status: DC | PRN
  Administered 2013-09-20: 20 mL

## 2013-09-20 MED ORDER — KETOROLAC TROMETHAMINE 30 MG/ML IJ SOLN
INTRAMUSCULAR | Status: DC | PRN
  Administered 2013-09-20: 30 mg via INTRAVENOUS

## 2013-09-20 MED ORDER — PROMETHAZINE HCL 25 MG/ML IJ SOLN: 6.2500 mg | INTRAMUSCULAR | Status: DC | PRN

## 2013-09-20 MED ORDER — ONDANSETRON HCL 4 MG/2ML IJ SOLN
INTRAMUSCULAR | Status: DC | PRN
  Administered 2013-09-20: 4 mg via INTRAVENOUS

## 2013-09-20 SURGICAL SUPPLY — 18 items
CATH ROBINSON RED A/P 16FR (CATHETERS) ×3 IMPLANT
CLOTH BEACON ORANGE TIMEOUT ST (SAFETY) ×3 IMPLANT
DECANTER SPIKE VIAL GLASS SM (MISCELLANEOUS) ×3 IMPLANT
GLOVE BIO SURGEON STRL SZ 6.5 (GLOVE) ×2 IMPLANT
GLOVE BIO SURGEONS STRL SZ 6.5 (GLOVE) ×1
GLOVE BIOGEL PI IND STRL 7.0 (GLOVE) ×1 IMPLANT
GLOVE BIOGEL PI INDICATOR 7.0 (GLOVE) ×2
GOWN STRL REUS W/TWL LRG LVL3 (GOWN DISPOSABLE) ×6 IMPLANT
KIT BERKELEY 1ST TRIMESTER 3/8 (MISCELLANEOUS) ×3 IMPLANT
PACK VAGINAL MINOR WOMEN LF (CUSTOM PROCEDURE TRAY) ×3 IMPLANT
PAD OB MATERNITY 4.3X12.25 (PERSONAL CARE ITEMS) ×3 IMPLANT
PAD PREP 24X48 CUFFED NSTRL (MISCELLANEOUS) ×3 IMPLANT
SET BERKELEY SUCTION TUBING (SUCTIONS) ×3 IMPLANT
TOWEL OR 17X24 6PK STRL BLUE (TOWEL DISPOSABLE) ×6 IMPLANT
VACURETTE 10 RIGID CVD (CANNULA) IMPLANT
VACURETTE 7MM CVD STRL WRAP (CANNULA) IMPLANT
VACURETTE 8 RIGID CVD (CANNULA) IMPLANT
VACURETTE 9 RIGID CVD (CANNULA) IMPLANT

## 2013-09-20 NOTE — Discharge Instructions (Signed)
Dilation and Curettage or Vacuum Curettage Dilation and curettage (D&C) and vacuum curettage are minor procedures. A D&C involves stretching (dilation) the cervix and scraping (curettage) the inside lining of the womb (uterus). During a D&C, tissue is gently scraped from the inside lining of the uterus. During a vacuum curettage, the lining and tissue in the uterus are removed with the use of gentle suction.  Curettage may be performed to either diagnose or treat a problem. As a diagnostic procedure, curettage is performed to examine tissues from the uterus. A diagnostic curettage may be performed for the following symptoms:   Irregular bleeding in the uterus.   Bleeding with the development of clots.   Spotting between menstrual periods.   Prolonged menstrual periods.   Bleeding after menopause.   No menstrual period (amenorrhea).   A change in size and shape of the uterus.  As a treatment procedure, curettage may be performed for the following reasons:   Removal of an IUD (intrauterine device).   Removal of retained placenta after giving birth. Retained placenta can cause an infection or bleeding severe enough to require transfusions.   Abortion.   Miscarriage.   Removal of polyps inside the uterus.   Removal of uncommon types of noncancerous lumps (fibroids).  LET [facility] CARE PROVIDER KNOW ABOUT:   Any allergies you have.   All medicines you are taking, including vitamins, herbs, eye drops, creams, and over-the-counter medicines.   Previous problems you or members of your family have had with the use of anesthetics.   Any blood disorders you have.   Previous surgeries you have had.   Medical conditions you have. RISKS AND COMPLICATIONS  Generally, this is a safe procedure. However, as with any procedure, complications can occur. Possible complications include:  Excessive bleeding.   Infection of the uterus.   Damage to the cervix.    Development of scar tissue (adhesions) inside the uterus, later causing abnormal amounts of menstrual bleeding.   Complications from the general anesthetic, if a general anesthetic is used.   Putting a hole (perforation) in the uterus. This is rare.  BEFORE THE PROCEDURE   Eat and drink before the procedure only as directed by your health care provider.   Arrange for someone to take you home.  PROCEDURE  This procedure usually takes about 15-30 minutes.  You will be given one of the following:  A medicine that numbs the area in and around the cervix (local anesthetic).   A medicine to make you sleep through the procedure (general anesthetic).  You will lie on your back with your legs in stirrups.   A warm metal or plastic instrument (speculum) will be placed in your vagina to keep it open and to allow the health care provider to see the cervix.  There are two ways in which your cervix can be softened and dilated. These include:   Taking a medicine.   Having thin rods (laminaria) inserted into your cervix.   A curved tool (curette) will be used to scrape cells from the inside lining of the uterus. In some cases, gentle suction is applied with the curette. The curette will then be removed.  AFTER THE PROCEDURE   You will rest in the recovery area until you are stable and are ready to go home.   You may feel sick to your stomach (nauseous) or throw up (vomit) if you were given a general anesthetic.   You may have a sore throat if a tube  was placed in your throat during general anesthesia.   You may have light cramping and bleeding. This may last for 2 days to 2 weeks after the procedure.   Your uterus needs to make a new lining after the procedure. This may make your next period late. Document Released: 01/10/2005 Document Revised: 09/12/2012 Document Reviewed: 08/09/2012 Exit[employer] Patient Information 2015 [employer]. This information is not intended to  replace advice given to you by your health care provider. Make sure you discuss any questions you have with your health care provider.  

## 2013-09-20 NOTE — Discharge Summary (Signed)
  Physician Discharge Summary  Patient ID: [name] MRN: [id number] DOB/AGE: 1980/04/20 33 y.o.  Admit date: 09/20/2013 Discharge date: 09/20/2013  Admission Diagnoses: Incomplete Abortion  Discharge Diagnoses: Incomplete Abortion        Active Problems:   * No active hospital problems. *   Discharged Condition: good  Hospital Course: Outpatient  Consults: None  Treatments: surgery: Dilation + Evacuation  Disposition: 01-Home or Self Care     Medication List         amphetamine-dextroamphetamine 20 MG tablet  Commonly known as:  ADDERALL  Take 20 mg by mouth 3 (three) times daily.     oxyCODONE-acetaminophen 7.5-325 MG per tablet  Commonly known as:  PERCOCET  Take 1 tablet by mouth every 4 (four) hours as needed for pain.           Follow-up Information   Follow up with Mattia Liford,MARIE-LYNE, MD In 3 weeks.   Specialty:  Obstetrics and Gynecology   Contact information:   [place] [address] [phone]       Signed: Princess Bruins, MD 09/20/2013, 12:42 PM

## 2013-09-20 NOTE — Transfer of Care (Signed)
Immediate Anesthesia Transfer of Care Note  Patient: [name]  Procedure(s) Performed: Procedure(s): DILATATION AND EVACUATION (N/A)  Patient Location: PACU  Anesthesia Type:MAC  Level of Consciousness: awake, alert  and oriented  Airway & Oxygen Therapy: Patient Spontanous Breathing  Post-op Assessment: Report given to PACU RN and Post -op Vital signs reviewed and stable  Post vital signs: Reviewed and stable  Complications: No apparent anesthesia complications

## 2013-09-20 NOTE — Anesthesia Postprocedure Evaluation (Signed)
  Anesthesia Post-op Note  Anesthesia Post Note  Patient: [name]  Procedure(s) Performed: Procedure(s) (LRB): DILATATION AND EVACUATION (N/A)  Anesthesia type: MAC  Patient location: PACU  Post pain: Pain level controlled  Post assessment: Post-op Vital signs reviewed  Last Vitals:  Filed Vitals:   09/20/13 1326  BP:   Pulse: 56  Temp: 36.7 C  Resp: 16    Post vital signs: Reviewed  Level of consciousness: sedated  Complications: No apparent anesthesia complications

## 2013-09-20 NOTE — Op Note (Signed)
09/20/2013  12:34 PM  PATIENT:  [name]  33 y.o. female  PRE-OPERATIVE DIAGNOSIS:  Incomplete Abortion  POST-OPERATIVE DIAGNOSIS:  incomplete abortion  PROCEDURE:  Procedure(s): DILATATION AND EVACUATION  SURGEON:  Surgeon(s): Princess Bruins, MD  ASSISTANTS: none   ANESTHESIA:   IV sedation and paracervical block  PROCEDURE:  Under IV sedation, the patient is in lithotomy position.  She is prepped with Betadine on the suprapubic, vulvar and vaginal areas.  The bladder is catheterized. She is draped as usual.  The vaginal exam reveals an anteverted uterus with no adnexal mass.  The speculum is inserted in the vagina. And the anterior lip of the cervix is grasped with a tenaculum.  A paracervical block is done with lidocaine 1% a total of 20 cc at 4 and 8:00. Dilation of the cervix with Hegar dilators up to #29 without difficulty.  We used a curved suction curette #9.  Suction of the intrauterine cavity, products of conception corresponding to 7-8 weeks are removed.  We then used a sharp curet to gently curettage the intrauterine cavity on all surfaces. We went back with suctioned to remove any products of conception or blood clots left.  The uterus contracts well and the instruments.  All instruments are removed. Hemostasis was adequate. The patient was brought to recovery room in good and stable status. Note that her blood group is Rh+.  ESTIMATED BLOOD LOSS:  50 cc   Intake/Output Summary (Last 24 hours) at 09/20/13 1234 Last data filed at 09/20/13 1225  Gross per 24 hour  Intake      0 ml  Output    100 ml  Net   -100 ml     BLOOD ADMINISTERED:none   LOCAL MEDICATIONS USED:  LIDOCAINE  and Amount: 20 ml  SPECIMEN:  Source of Specimen:  Products of conception  DISPOSITION OF SPECIMEN:  PATHOLOGY  COUNTS:  YES  PLAN OF CARE: Transfer to PACU   Princess Bruins MD  09/20/13  at 12:35

## 2013-09-20 NOTE — Anesthesia Preprocedure Evaluation (Addendum)
Anesthesia Evaluation  Patient identified by MRN, date of birth, ID band Patient awake    Reviewed: Allergy & Precautions, H&P , NPO status , Patient's Chart, lab work & pertinent test results, reviewed documented beta blocker date and time   History of Anesthesia Complications Negative for: history of anesthetic complications  Airway Mallampati: I TM Distance: >3 FB Neck ROM: full    Dental  (+) Teeth Intact   Pulmonary Current Smoker,  breath sounds clear to auscultation        Cardiovascular negative cardio ROS  Rhythm:regular Rate:Normal     Neuro/Psych PSYCHIATRIC DISORDERS (ADD) negative neurological ROS     GI/Hepatic negative GI ROS, Neg liver ROS,   Endo/Other  BMI 31  Renal/GU negative Renal ROS   H/o ovarian CA - s/p oophorectomy and chemotherapy     Musculoskeletal   Abdominal   Peds  Hematology negative hematology ROS (+)   Anesthesia Other Findings   Reproductive/Obstetrics (+) Pregnancy (incomplete ab, 7 weeks)                        Anesthesia Physical Anesthesia Plan  ASA: II  Anesthesia Plan: MAC   Post-op Pain Management:    Induction:   Airway Management Planned:   Additional Equipment:   Intra-op Plan:   Post-operative Plan:   Informed Consent: I have reviewed the patients History and Physical, chart, labs and discussed the procedure including the risks, benefits and alternatives for the proposed anesthesia with the patient or authorized representative who has indicated his/her understanding and acceptance.     Plan Discussed with: CRNA and Surgeon  Anesthesia Plan Comments:         Anesthesia Quick Evaluation

## 2013-09-20 NOTE — H&P (Signed)
[name] is an 33 y.o. female [phone]  RP:  MAB post Mifeprex for D+E  Pertinent Gynecological History:  Blood transfusions: none Sexually transmitted diseases: no past history Last pap: normal OB History: [id number]   Menstrual History:  Patient's last menstrual period was 07/12/2013.    Past Medical History  Diagnosis Date  . Cancer   . Ovarian cancer     Past Surgical History  Procedure Laterality Date  . Oophorectomy    . Dilation and evacuation N/A 05/16/2013    Procedure: DILATATION AND EVACUATION;  Surgeon: Sanjuana Kava, MD;  Location: [facility];  Service: Gynecology;  Laterality: N/A;    History reviewed. No pertinent family history.  Social History:  reports that she has been smoking Cigarettes.  She has been smoking about 1.00 pack per day. She does not have any smokeless tobacco history on file. She reports that she drinks alcohol. She reports that she does not use illicit drugs.  Allergies: No Known Allergies  Prescriptions prior to admission  Medication Sig Dispense Refill  . amphetamine-dextroamphetamine (ADDERALL) 20 MG tablet Take 20 mg by mouth 3 (three) times daily.         ROS  Blood pressure 119/67, pulse 63, temperature 98.2 F (36.8 C), temperature source Oral, height 5\' 9"  (1.753 m), weight 94.802 kg (209 lb), last menstrual period 07/12/2013, SpO2 100.00%. Physical Exam  Results for orders placed during the hospital encounter of 09/20/13 (from the past 24 hour(s))  CBC     Status: None   Collection Time    09/20/13 10:18 AM      Result Value Ref Range   WBC 8.4  4.0 - 10.5 K/uL   RBC 4.36  3.87 - 5.11 MIL/uL   Hemoglobin 13.3  12.0 - 15.0 g/dL   HCT 37.3  36.0 - 46.0 %   MCV 85.6  78.0 - 100.0 fL   MCH 30.5  26.0 - 34.0 pg   MCHC 35.7  30.0 - 36.0 g/dL   RDW 13.2  11.5 - 15.5 %   Platelets 206  150 - 400 K/uL    No results found.  Assessment/Plan: Missed abortion post Mifeprex for D+E.  Surgery and risks reviewed.  Rh  pos.  Viha Kriegel,MARIE-LYNE 09/20/2013, 11:52 AM

## 2013-09-23 ENCOUNTER — Encounter (HOSPITAL_COMMUNITY): Payer: Self-pay | Admitting: Obstetrics & Gynecology

## 2013-11-25 ENCOUNTER — Encounter (HOSPITAL_COMMUNITY): Payer: Self-pay | Admitting: Obstetrics & Gynecology

## 2014-02-21 ENCOUNTER — Encounter (HOSPITAL_COMMUNITY): Payer: Self-pay | Admitting: *Deleted

## 2015-08-26 IMAGING — US US ABDOMEN LIMITED
1 series · 14 of 25 positions shown · non-contrast
Comparison: None.

CLINICAL DATA: Right upper quadrant pain

EXAM:
US ABDOMEN LIMITED - RIGHT UPPER QUADRANT

[Series 1: us abdomen limited ruq/ascites · 14 of 36 slices shown]
[im 1/36]
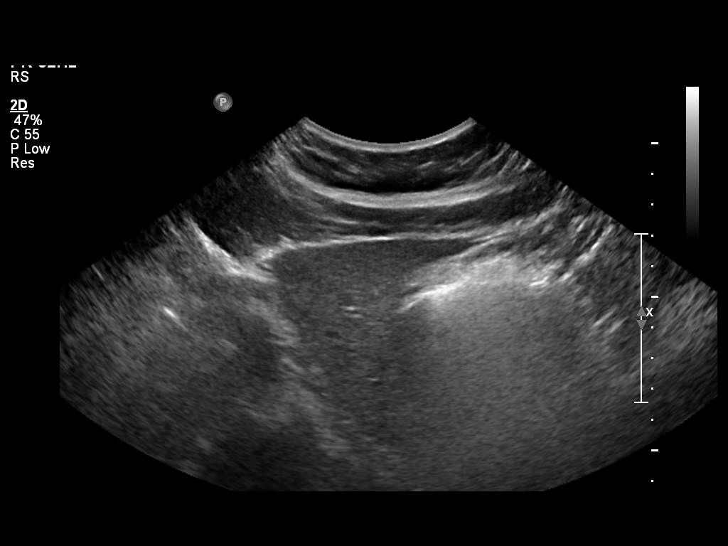
[im 3/36]
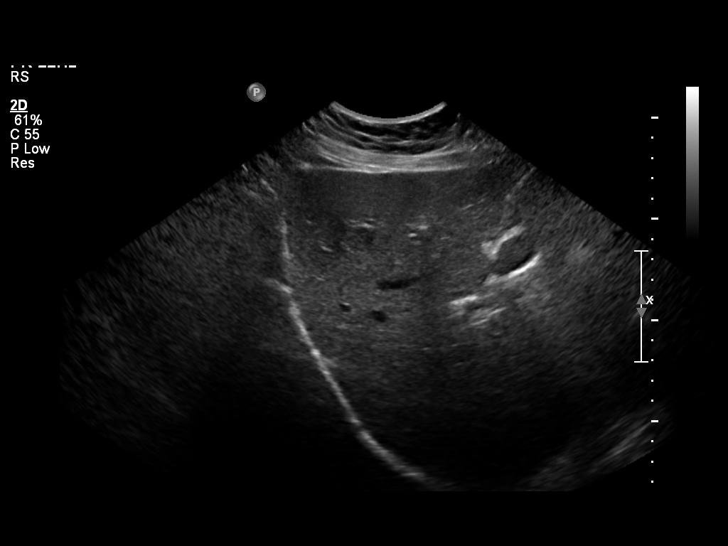
[im 6/36]
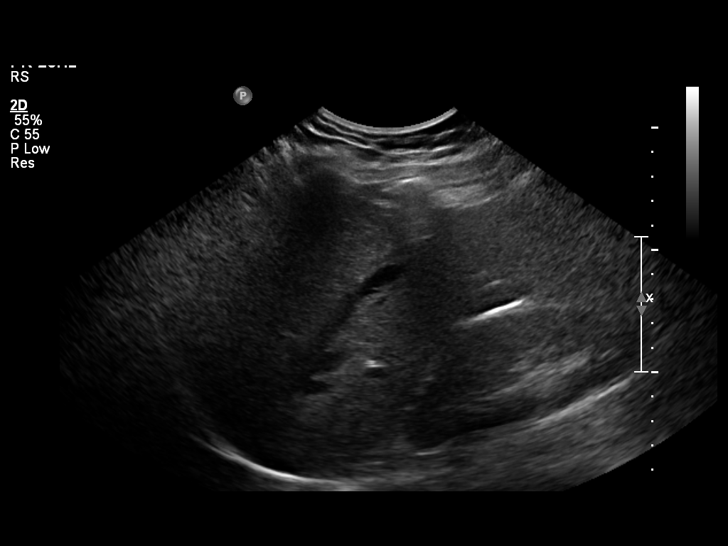
[im 9/36]
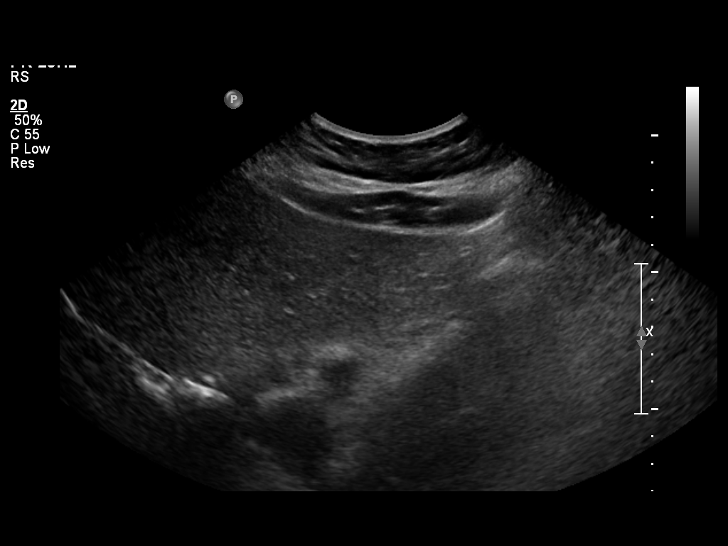
[im 12/36]
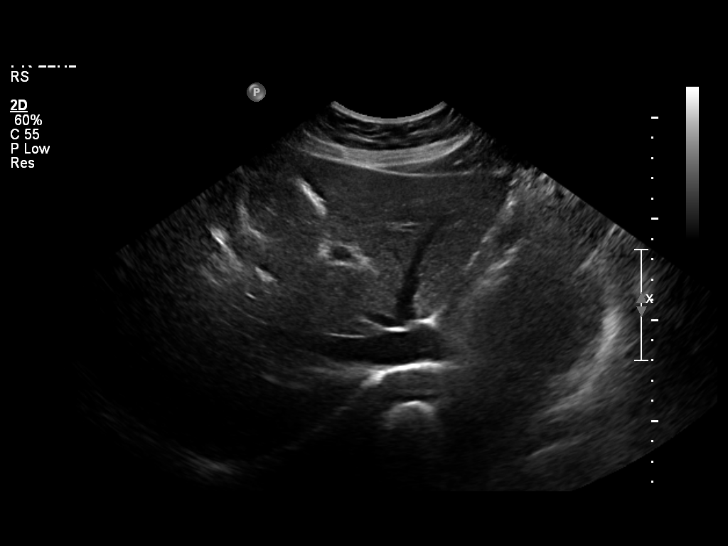
[im 14/36]
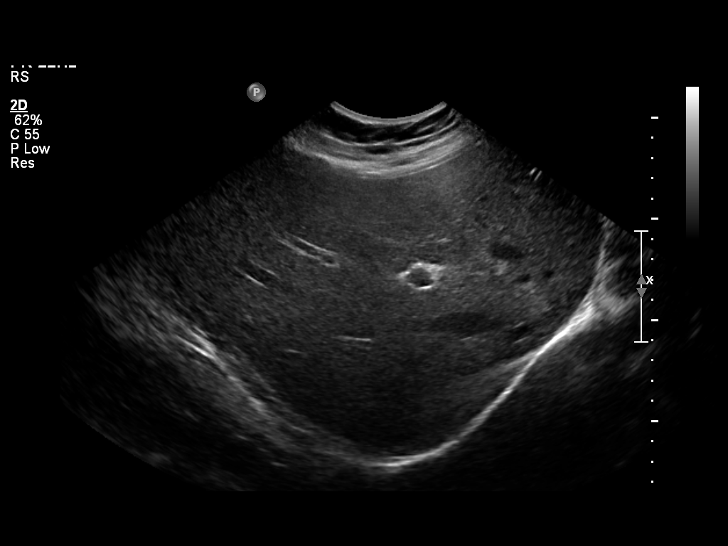
[im 17/36]
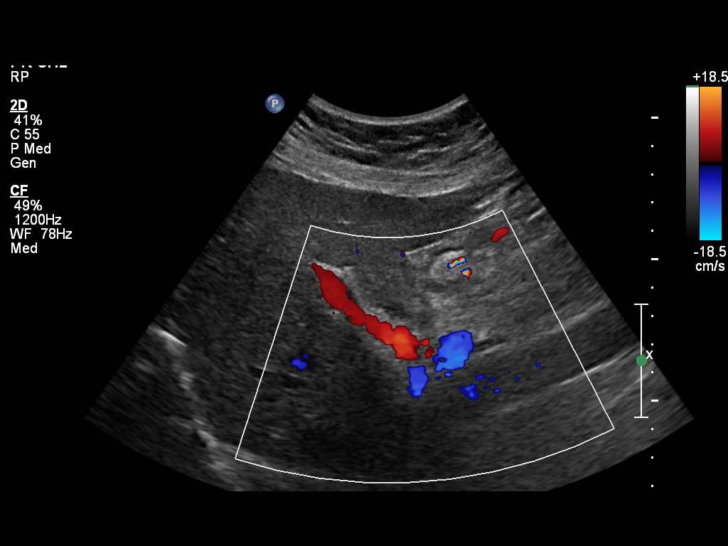
[im 19/36]
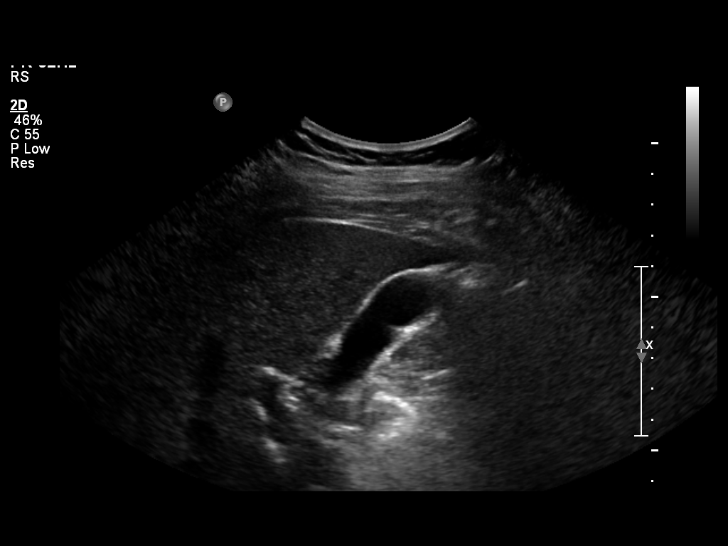
[im 22/36]
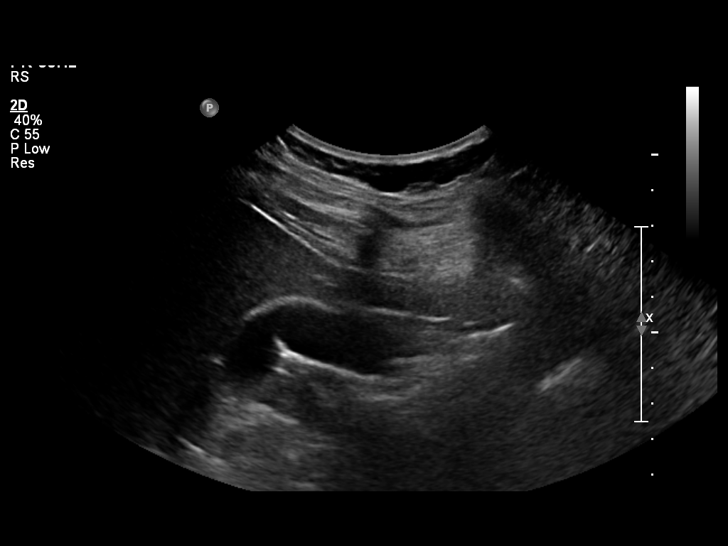
[im 24/36]
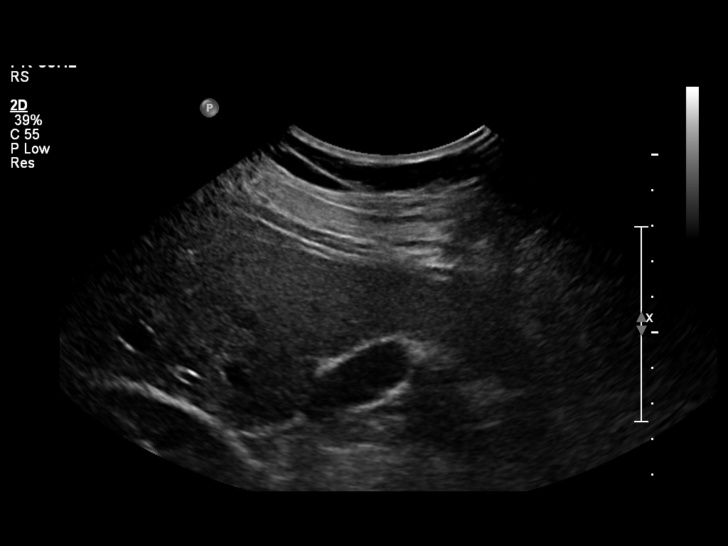
[im 27/36]
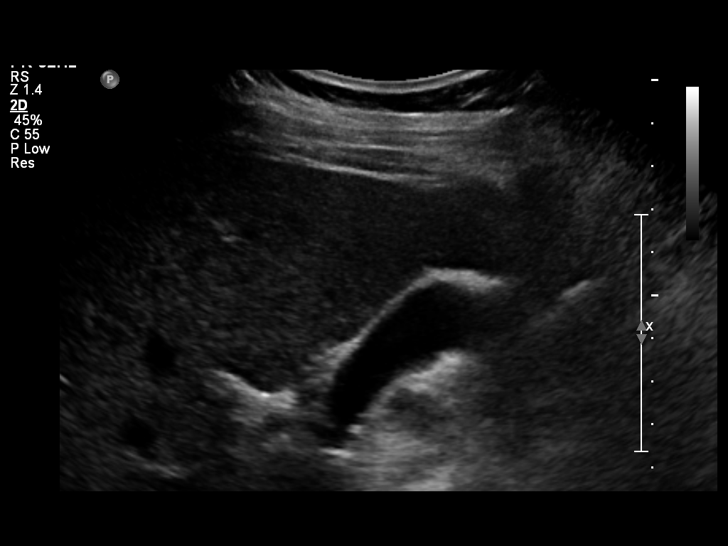
[im 30/36]
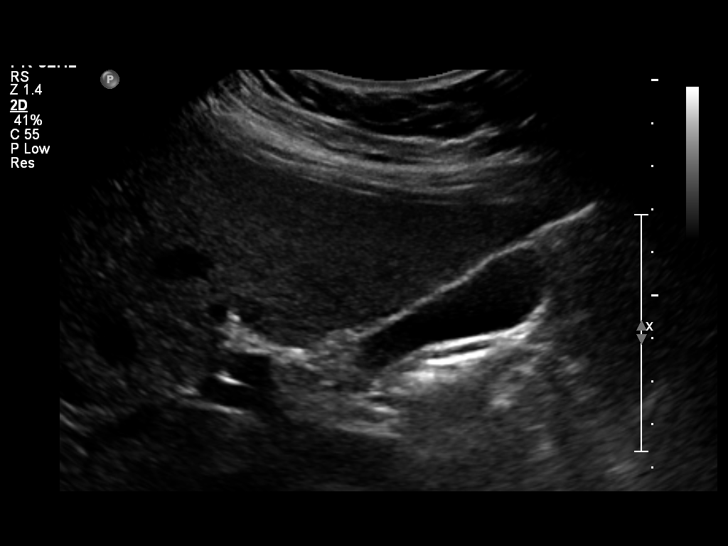
[im 33/36]
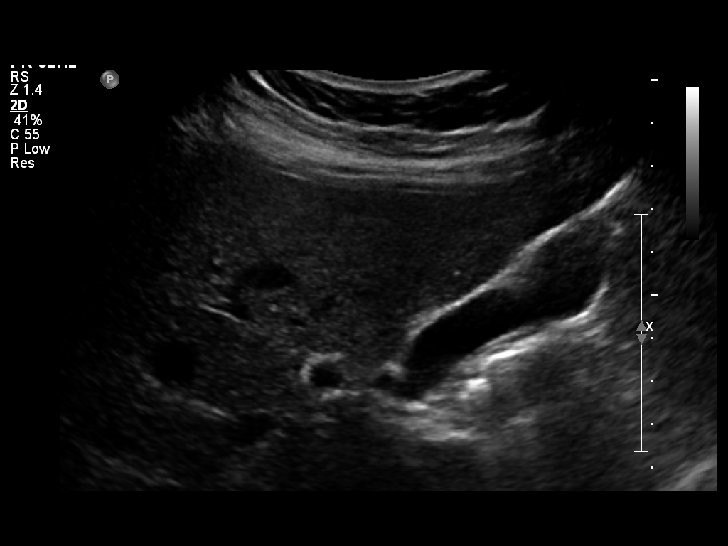
[im 36/36]
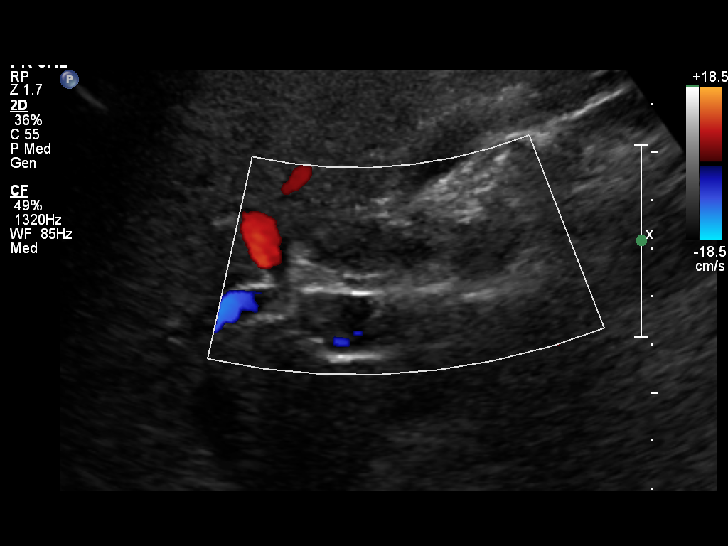

[14 of 25 positions shown; findings below may reference images not displayed]

FINDINGS: Gallbladder:

No gallstones or wall thickening visualized. No sonographic Murphy
sign noted. There is a 4 mm echogenic non mobile, non shadowing
focus along the gallbladder wall likely representing a small polyp.

Common bile duct:

Diameter:  4 mm

Liver:

No focal lesion identified. Within normal limits in parenchymal
echogenicity.
IMPRESSION: 1. No cholelithiasis or sonographic evidence of acute cholecystitis.
2. Small gallbladder polyp.

## 2018-07-17 ENCOUNTER — Other Ambulatory Visit: Payer: Self-pay | Admitting: Internal Medicine

## 2018-07-17 DIAGNOSIS — Z20822 Contact with and (suspected) exposure to covid-19: Secondary | ICD-10-CM

## 2018-07-20 LAB — NOVEL CORONAVIRUS, NAA: SARS-CoV-2, NAA: NOT DETECTED

## 2018-09-12 ENCOUNTER — Other Ambulatory Visit: Payer: Self-pay

## 2018-09-12 DIAGNOSIS — Z20822 Contact with and (suspected) exposure to covid-19: Secondary | ICD-10-CM

## 2018-09-13 LAB — NOVEL CORONAVIRUS, NAA: SARS-CoV-2, NAA: NOT DETECTED

## 2018-10-24 ENCOUNTER — Other Ambulatory Visit: Payer: Self-pay

## 2018-10-24 DIAGNOSIS — Z20822 Contact with and (suspected) exposure to covid-19: Secondary | ICD-10-CM

## 2018-10-25 LAB — NOVEL CORONAVIRUS, NAA: SARS-CoV-2, NAA: NOT DETECTED

## 2018-11-20 ENCOUNTER — Other Ambulatory Visit: Payer: Self-pay

## 2018-11-20 DIAGNOSIS — Z20822 Contact with and (suspected) exposure to covid-19: Secondary | ICD-10-CM

## 2018-11-22 LAB — NOVEL CORONAVIRUS, NAA: SARS-CoV-2, NAA: NOT DETECTED

## 2018-12-24 ENCOUNTER — Other Ambulatory Visit: Payer: Self-pay

## 2018-12-24 DIAGNOSIS — Z20822 Contact with and (suspected) exposure to covid-19: Secondary | ICD-10-CM

## 2018-12-25 LAB — NOVEL CORONAVIRUS, NAA: SARS-CoV-2, NAA: NOT DETECTED

## 2022-08-09 ENCOUNTER — Other Ambulatory Visit: Payer: Self-pay

## 2022-08-09 ENCOUNTER — Emergency Department (HOSPITAL_BASED_OUTPATIENT_CLINIC_OR_DEPARTMENT_OTHER)
Admission: EM | Admit: 2022-08-09 | Discharge: 2022-08-10 | Disposition: A | Discharge status: Home / Self Care | Payer: No Typology Code available for payment source | Attending: Emergency Medicine | Admitting: Emergency Medicine

## 2022-08-09 ENCOUNTER — Emergency Department (HOSPITAL_BASED_OUTPATIENT_CLINIC_OR_DEPARTMENT_OTHER): Payer: No Typology Code available for payment source | Admitting: Radiology

## 2022-08-09 DIAGNOSIS — R202 Paresthesia of skin: Secondary | ICD-10-CM

## 2022-08-09 DIAGNOSIS — Z7982 Long term (current) use of aspirin: Secondary | ICD-10-CM | POA: Insufficient documentation

## 2022-08-09 DIAGNOSIS — R002 Palpitations: Secondary | ICD-10-CM | POA: Insufficient documentation

## 2022-08-09 DIAGNOSIS — R209 Unspecified disturbances of skin sensation: Secondary | ICD-10-CM | POA: Insufficient documentation

## 2022-08-09 DIAGNOSIS — R110 Nausea: Secondary | ICD-10-CM | POA: Insufficient documentation

## 2022-08-09 LAB — CBC
HCT: 41.7 % (ref 36.0–46.0)
Hemoglobin: 14.8 g/dL (ref 12.0–15.0)
MCH: 31 pg (ref 26.0–34.0)
MCHC: 35.5 g/dL (ref 30.0–36.0)
MCV: 87.4 fL (ref 80.0–100.0)
Platelets: 275 10*3/uL (ref 150–400)
RBC: 4.77 MIL/uL (ref 3.87–5.11)
RDW: 13.2 % (ref 11.5–15.5)
WBC: 8.1 10*3/uL (ref 4.0–10.5)
nRBC: 0 % (ref 0.0–0.2)

## 2022-08-09 LAB — BASIC METABOLIC PANEL
Anion gap: 11 (ref 5–15)
BUN: 14 mg/dL (ref 6–20)
CO2: 20 mmol/L — ABNORMAL LOW (ref 22–32)
Calcium: 9.8 mg/dL (ref 8.9–10.3)
Chloride: 105 mmol/L (ref 98–111)
Creatinine, Ser: 0.92 mg/dL (ref 0.44–1.00)
GFR, Estimated: >60 mL/min (ref >60)
Glucose, Bld: 92 mg/dL (ref 70–99)
Potassium: 4 mmol/L (ref 3.5–5.1)
Sodium: 136 mmol/L (ref 135–145)

## 2022-08-09 LAB — CBG MONITORING, ED
Glucose-Capillary: 90 mg/dL (ref 70–99)
Glucose-Capillary: 92 mg/dL (ref 70–99)

## 2022-08-09 LAB — PREGNANCY, URINE: Preg Test, Ur: NEGATIVE

## 2022-08-09 LAB — TROPONIN I (HIGH SENSITIVITY): Troponin I (High Sensitivity): 3 ng/L (ref <18)

## 2022-08-09 NOTE — ED Triage Notes (Addendum)
Left arm numbness into tongue- 1845. Palpitations. Nauseated. Denies dizziness, vision changes, SOB.  Upon arrival to triage numbness has resolved- correlates numbness intermittent with palpitations.

## 2022-08-10 ENCOUNTER — Emergency Department (HOSPITAL_BASED_OUTPATIENT_CLINIC_OR_DEPARTMENT_OTHER): Payer: No Typology Code available for payment source

## 2022-08-10 ENCOUNTER — Encounter: Payer: Self-pay | Admitting: Neurology

## 2022-08-10 LAB — TROPONIN I (HIGH SENSITIVITY): Troponin I (High Sensitivity): 2 ng/L (ref <18)

## 2022-08-10 MED ORDER — IOHEXOL 350 MG/ML SOLN
100.0000 mL | Freq: Once | INTRAVENOUS | Status: AC | PRN
  Administered 2022-08-10: 75 mL via INTRAVENOUS

## 2022-08-10 MED ORDER — ASPIRIN 81 MG PO CHEW: 81.0000 mg | CHEWABLE_TABLET | Freq: Every day | ORAL | 0 refills | Status: AC

## 2022-08-10 NOTE — ED Provider Notes (Signed)
[place] EMERGENCY DEPARTMENT AT [facility] Provider Note   CSN: [id number] Arrival date & time: 08/09/22  1935     History  No chief complaint on file.   [name] is a 42 y.o. female.  42 year old female who presents ER today secondary to left arm numbness with palpitations.  Patient states that she was working from home with a computer and had some tingling in all of her left fingertips.  No other issues at that time but then later that day she went to do her [employer] job and while doing that her left arm progressively got more numb and then seem to go up into her left neck and then started some left tongue numbness.  She started have some nausea during this as well also felt like her heart was beating a little bit faster than normal.  Not related any symptoms like this before.  No issues with talking, breathing or facial asymmetry during this time.  No history of stroke or TIAs.  No recent infections, trauma or associated symptoms.   Home Medications Prior to Admission medications   Medication Sig Start Date End Date Taking? Authorizing Provider  aspirin 81 MG chewable tablet Chew 1 tablet (81 mg total) by mouth daily. 08/10/22  Yes Quayshawn Nin, Barbara Cower, MD  amphetamine-dextroamphetamine (ADDERALL) 20 MG tablet Take 20 mg by mouth 3 (three) times daily.     [provider]  oxyCODONE-acetaminophen (PERCOCET) 7.5-325 MG per tablet Take 1 tablet by mouth every 4 (four) hours as needed for pain. 09/20/13   Genia Del, MD      Allergies    Patient has no known allergies.    Review of Systems   Review of Systems  Physical Exam Updated Vital Signs BP 131/82   Pulse 74   Temp 98 F (36.7 C) (Oral)   Resp 18   Ht 5\' 9"  (1.753 m)   Wt 117.9 kg   SpO2 100%   BMI 38.40 kg/m  Physical Exam Vitals and nursing note reviewed.  Constitutional:      Appearance: She is well-developed.  HENT:     Head: Normocephalic and atraumatic.  Cardiovascular:      Rate and Rhythm: Normal rate and regular rhythm.  Pulmonary:     Effort: No respiratory distress.     Breath sounds: No stridor.  Abdominal:     General: There is no distension.  Musculoskeletal:     Cervical back: Normal range of motion.  Neurological:     Mental Status: She is alert.     Comments: No altered mental status, able to give full seemingly accurate history.  Face is symmetric, EOM's intact, pupils equal and reactive, vision intact, tongue and uvula midline without deviation. Upper and Lower extremity motor 5/5, intact pain perception in distal extremities, 2+ reflexes in biceps, patella and achilles tendons. Able to perform finger to nose normal with both hands. Walks without assistance or evident ataxia.       ED Results / Procedures / Treatments   Labs (all labs ordered are listed, but only abnormal results are displayed) Labs Reviewed  BASIC METABOLIC PANEL - Abnormal; Notable for the following components:      Result Value   CO2 20 (*)    All other components within normal limits  CBC  PREGNANCY, URINE  CBG MONITORING, ED  CBG MONITORING, ED  TROPONIN I (HIGH SENSITIVITY)  TROPONIN I (HIGH SENSITIVITY)    EKG EKG Interpretation Date/Time:  Tuesday August 09 2022 19:47:18 EDT Ventricular Rate:  95 PR Interval:  162 QRS Duration:  80 QT Interval:  348 QTC Calculation: 437 R Axis:   89  Text Interpretation: Normal sinus rhythm Normal ECG No previous ECGs available Confirmed by Marily Memos [phone]) on 08/09/2022 11:01:08 PM  Radiology CT ANGIO HEAD NECK W WO CM  Result Date: 08/10/2022 CLINICAL DATA:  Stroke/TIA EXAM: CT ANGIOGRAPHY HEAD AND NECK WITH AND WITHOUT CONTRAST TECHNIQUE: Multidetector CT imaging of the head and neck was performed using the standard protocol during bolus administration of intravenous contrast. Multiplanar CT image reconstructions and MIPs were obtained to evaluate the vascular anatomy. Carotid stenosis measurements (when  applicable) are obtained utilizing NASCET criteria, using the distal internal carotid diameter as the denominator. RADIATION DOSE REDUCTION: This exam was performed according to the departmental dose-optimization program which includes automated exposure control, adjustment of the mA and/or kV according to patient size and/or use of iterative reconstruction technique. CONTRAST:  75mL OMNIPAQUE IOHEXOL 350 MG/ML SOLN COMPARISON:  None Available. FINDINGS: CT HEAD FINDINGS Brain: There is no mass, hemorrhage or extra-axial collection. The size and configuration of the ventricles and extra-axial CSF spaces are normal. There is no acute or chronic infarction. The brain parenchyma is normal. Skull: The visualized skull base, calvarium and extracranial soft tissues are normal. Sinuses/Orbits: No fluid levels or advanced mucosal thickening of the visualized paranasal sinuses. No mastoid or middle ear effusion. The orbits are normal. CTA NECK FINDINGS SKELETON: There is no bony spinal canal stenosis. No lytic or blastic lesion. OTHER NECK: Normal pharynx, larynx and major salivary glands. No cervical lymphadenopathy. Unremarkable thyroid gland. UPPER CHEST: No pneumothorax or pleural effusion. No nodules or masses. AORTIC ARCH: There is no calcific atherosclerosis of the aortic arch. Conventional 3 vessel aortic branching pattern. RIGHT CAROTID SYSTEM: Normal without aneurysm, dissection or stenosis. LEFT CAROTID SYSTEM: Normal without aneurysm, dissection or stenosis. VERTEBRAL ARTERIES: Left dominant configuration.There is no dissection, occlusion or flow-limiting stenosis to the skull base (V1-V3 segments). CTA HEAD FINDINGS POSTERIOR CIRCULATION: --Vertebral arteries: Normal V4 segments. --Inferior cerebellar arteries: Normal. --Basilar artery: Normal. --Superior cerebellar arteries: Normal. --Posterior cerebral arteries (PCA): Normal. ANTERIOR CIRCULATION: --Intracranial internal carotid arteries: Normal. --Anterior  cerebral arteries (ACA): Normal. Both A1 segments are present. Patent anterior communicating artery (a-comm). --Middle cerebral arteries (MCA): Normal. VENOUS SINUSES: As permitted by contrast timing, patent. ANATOMIC VARIANTS: None Review of the MIP images confirms the above findings. IMPRESSION: Normal CTA of the head and neck. Electronically Signed   By: Deatra Robinson M.D.   On: 08/10/2022 01:02   DG Chest 2 View  Result Date: 08/09/2022 CLINICAL DATA:  Chest pain EXAM: CHEST - 2 VIEW COMPARISON:  None Available. FINDINGS: The heart size and mediastinal contours are within normal limits. Both lungs are clear. The visualized skeletal structures are unremarkable. IMPRESSION: No active cardiopulmonary disease. Electronically Signed   By: Larose Hires D.O.   On: 08/09/2022 20:52    Procedures Procedures    Medications Ordered in ED Medications  iohexol (OMNIPAQUE) 350 MG/ML injection 100 mL (75 mLs Intravenous Contrast Given 08/10/22 0030)    ED Course/ Medical Decision Making/ A&P                             Medical Decision Making Amount and/or Complexity of Data Reviewed Labs: ordered. Radiology: ordered.  Risk OTC drugs. Prescription drug management.   CT angio of head and neck done to ensure  no critical stenosis or evidence of stroke.  This was negative.  Workup reassuring.  Patient is asymptomatic during most of her time in the ER and the whole time she has been back into treatment room.  I discussed my inconclusive findings.  Also discussed that yet the assume the worst when you come in the emergency room so she should probably follow-up with her PCP/neurology in case this was a TIA.  Unlikely to be cardiac.  Will return here for any persistent, new or worsening symptoms.   Final Clinical Impression(s) / ED Diagnoses Final diagnoses:  Paresthesia of arm    Rx / DC Orders ED Discharge Orders          Ordered    Ambulatory referral to Neurology       Comments: An  appointment is requested in approximately: 2 weeks   08/10/22 0148    aspirin 81 MG chewable tablet  Daily        08/10/22 0149              Makesha Belitz, Barbara Cower, MD 08/10/22 [phone]

## 2022-09-05 ENCOUNTER — Encounter: Payer: Self-pay | Admitting: Neurology

## 2022-09-05 ENCOUNTER — Ambulatory Visit (INDEPENDENT_AMBULATORY_CARE_PROVIDER_SITE_OTHER): Payer: No Typology Code available for payment source | Admitting: Neurology

## 2022-09-05 VITALS — BP 130/85 | HR 75 | Ht 69.0 in | Wt 267.0 lb

## 2022-09-05 DIAGNOSIS — R202 Paresthesia of skin: Secondary | ICD-10-CM | POA: Diagnosis not present

## 2022-09-05 DIAGNOSIS — R200 Anesthesia of skin: Secondary | ICD-10-CM

## 2022-09-05 NOTE — Progress Notes (Signed)
[facility] Clinic Note - Initial Visit   Date: 09/05/2022   SHANERIA Reyes MRN: [id number] DOB: [birth year]   Dear Dr. Clayborne Dana:  Thank you for your kind referral of [name] for consultation of left arm tingling. Although her history is well known to you, please allow [name] to reiterate it for the purpose of our medical record. The patient was accompanied to the clinic by self.    [name] is a 42 y.o. left-handed female with ovarian cancer s/p resection and chemotherapy (2003) and ADHD presenting for evaluation of left arm tingling.   IMPRESSION/PLAN: Left arm and tongue tingling, transient without recurrence.  Exam today is normal.  Prior testing shows normal CTA head and neck.  I offered MRI brain to further evaluate her symptoms, however, given that she has been doing well, she opted to continue to monitor.  Stroke warning signs were discussed.  Strongly advised patient to stop smoking.   Return to clinic as needed  ------------------------------------------------------------- History of present illness: On 7/16, she developed tingling of the left fingertips, which radiated up the entire arm, neck, and left of the tongue. No associated weakness of the left arm or face, slurred speech, or headache.  Symptoms lasted about 30-minutes and then resolved.  She was driving for [employer] and had been in the car about 15 minutes.  She recalls that symptoms did not occur when she was driving, but when she returned back into the car from her drop off.    Occasionally, she has numbness in the left arm from sleeping on it.  She has never had it occurs in the tongue/face.  It has not recurred.  Because of her tongue symptoms, she was concerned and went to the Emergency Department where CTA head and neck was normal.   She denies having similar symptoms previously.    She smokes 1 ppd x 20 years.  She drinks socially.  She works for [employer].  Out-side paper records, electronic medical record, and images have been reviewed where available and summarized as:  CT head and neck 08/10/2022:  Normal CTA of the head and neck.    Past Medical History:  Diagnosis Date   Cancer (HCC)    Ovarian cancer [facility])     Past Surgical History:  Procedure Laterality Date   DILATION AND EVACUATION N/A 05/16/2013   Procedure: DILATATION AND EVACUATION;  Surgeon: Essie Hart, MD;  Location: [facility];  Service: Gynecology;  Laterality: N/A;   DILATION AND EVACUATION N/A 09/20/2013   Procedure: DILATATION AND EVACUATION;  Surgeon: Genia Del, MD;  Location: [facility];  Service: Gynecology;  Laterality: N/A;   OOPHORECTOMY       Medications:  Outpatient Encounter Medications as of 09/05/2022  Medication Sig   amphetamine-dextroamphetamine (ADDERALL) 20 MG tablet Take 20 mg by mouth 3 (three) times daily.    aspirin 81 MG chewable tablet Chew 1 tablet (81 mg total) by mouth daily.   [DISCONTINUED] oxyCODONE-acetaminophen (PERCOCET) 7.5-325 MG per tablet Take 1 tablet by mouth every 4 (four) hours as needed for pain. (Patient not taking: Reported on 09/05/2022)   No facility-administered encounter medications on file as of 09/05/2022.    Allergies: No Known Allergies  Family History: Family History  Problem Relation Age of Onset   Hypertension Father    Seizures Father    Breast cancer Maternal Grandmother    Colon cancer Maternal Grandfather    Ovarian cancer Paternal Grandmother  Kidney disease Paternal Grandfather    Kidney failure Paternal Grandfather     Social History: Social History   Tobacco Use   Smoking status: Heavy Smoker    Current packs/day: 1.00    Types: Cigarettes   Tobacco comments:    Smokes about 1 pack a day   Substance Use Topics   Alcohol use: Yes    Comment: socially   Drug use: No   Social History   Social History Narrative   Are you right handed or left handed? Left Handed    Are you  currently employed ? Yes   What is your current occupation? [employer]   Do you live at home alone? No   Who lives with you? With daughter    What type of home do you live in: 1 story or 2 story? Lives in a one story home on the 2nd level         Vital Signs:  BP 130/85   Pulse 75   Ht 5\' 9"  (1.753 m)   Wt 267 lb (121.1 kg)   LMP  (LMP Unknown)   SpO2 98%   BMI 39.43 kg/m   Neurological Exam: MENTAL STATUS including orientation to time, place, person, recent and remote memory, attention span and concentration, language, and fund of knowledge is normal.  Speech is not dysarthric.  CRANIAL NERVES: II:  No visual field defects.     III-IV-VI: Pupils equal round and reactive to light.  Normal conjugate, extra-ocular eye movements in all directions of gaze.  No nystagmus.  No ptosis.   V:  Normal facial sensation.    VII:  Normal facial symmetry and movements.   VIII:  Normal hearing and vestibular function.   IX-X:  Normal palatal movement.   XI:  Normal shoulder shrug and head rotation.   XII:  Normal tongue strength and range of motion, no deviation or fasciculation.  MOTOR:  Motor strength is 5/5 throughout. No atrophy, fasciculations or abnormal movements.  No pronator drift.   MSRs:                                           Right        Left brachioradialis 2+  2+  biceps 2+  2+  triceps 2+  2+  patellar 2+  2+  ankle jerk 2+  2+  Hoffman no  no  plantar response down  down   SENSORY:  Normal and symmetric perception of light touch, pinprick, vibration, and proprioception.  Romberg's sign absent.   COORDINATION/GAIT: Normal finger-to- nose-finger.  Intact rapid alternating movements bilaterally.  Gait narrow based and stable. Tandem and stressed gait intact.     Thank you for allowing me to participate in patient's care.  If I can answer any additional questions, I would be pleased to do so.    Sincerely,    Charell Faulk K. Allena Katz, DO

## 2022-09-05 NOTE — Patient Instructions (Signed)
If your have recurrence of symptoms, please let me know.  We can order MRI brain and/or nerve testing.  Please try to quit smoking  Return to clinic as needed
